# Patient Record
Sex: Female | Born: 1980 | Race: Black or African American | Hispanic: No | Marital: Single | Smoking: Current some day smoker
Health system: Southern US, Community
[De-identification: ages and names within clinical notes are randomized; demographics above are authoritative.]

## PROBLEM LIST (undated history)

## (undated) HISTORY — PX: ANTERIOR CRUCIATE LIGAMENT (ACL) REVISION: SHX6707

---

## 2018-06-13 ENCOUNTER — Ambulatory Visit (INDEPENDENT_AMBULATORY_CARE_PROVIDER_SITE_OTHER): Payer: Self-pay | Admitting: Physician Assistant

## 2018-07-04 ENCOUNTER — Ambulatory Visit (INDEPENDENT_AMBULATORY_CARE_PROVIDER_SITE_OTHER): Payer: Self-pay | Admitting: Family Medicine

## 2018-07-10 ENCOUNTER — Encounter (INDEPENDENT_AMBULATORY_CARE_PROVIDER_SITE_OTHER): Payer: Self-pay | Admitting: Internal Medicine

## 2018-07-10 ENCOUNTER — Ambulatory Visit (INDEPENDENT_AMBULATORY_CARE_PROVIDER_SITE_OTHER): Payer: Medicaid Other | Admitting: Internal Medicine

## 2018-07-10 ENCOUNTER — Other Ambulatory Visit: Payer: Self-pay

## 2018-07-10 VITALS — BP 126/82 | HR 86 | Temp 98.5°F | Ht 66.0 in | Wt 249.0 lb

## 2018-07-10 DIAGNOSIS — F17210 Nicotine dependence, cigarettes, uncomplicated: Secondary | ICD-10-CM | POA: Diagnosis not present

## 2018-07-10 DIAGNOSIS — E039 Hypothyroidism, unspecified: Secondary | ICD-10-CM | POA: Diagnosis not present

## 2018-07-10 DIAGNOSIS — R030 Elevated blood-pressure reading, without diagnosis of hypertension: Secondary | ICD-10-CM

## 2018-07-10 DIAGNOSIS — Z6841 Body Mass Index (BMI) 40.0 and over, adult: Secondary | ICD-10-CM

## 2018-07-10 DIAGNOSIS — G43509 Persistent migraine aura without cerebral infarction, not intractable, without status migrainosus: Secondary | ICD-10-CM

## 2018-07-10 DIAGNOSIS — F17200 Nicotine dependence, unspecified, uncomplicated: Secondary | ICD-10-CM | POA: Insufficient documentation

## 2018-07-10 MED ORDER — LEVOTHYROXINE SODIUM 75 MCG PO TABS: 75.0000 ug | ORAL_TABLET | Freq: Every day | ORAL | 2 refills | Status: DC

## 2018-07-10 MED ORDER — TOPIRAMATE 25 MG PO TABS: 25.0000 mg | ORAL_TABLET | Freq: Every day | ORAL | 3 refills | Status: DC

## 2018-07-10 MED ORDER — SUMATRIPTAN SUCCINATE 50 MG PO TABS: ORAL_TABLET | ORAL | 2 refills | Status: DC

## 2018-07-10 MED ORDER — CLONIDINE HCL 0.1 MG PO TABS: 0.1000 mg | ORAL_TABLET | Freq: Once | ORAL | Status: DC

## 2018-07-10 NOTE — Patient Instructions (Signed)
Stop over-the-counter pain medications.  Start taking Topamax at bedtime to help decrease the frequency of the headaches.  Take Imitrex as needed for the headache.  Follow a Healthy Eating Plan - You can do it! Limit sugary drinks.  Avoid sodas, sweet tea, sport or energy drinks, or fruit drinks.  Drink water, lo-fat milk, or diet drinks. Limit snack foods.   Cut back on candy, cake, cookies, chips, ice cream.  These are a special treat, only in small amounts. Eat plenty of vegetables.  Especially dark green, red, and orange vegetables. Aim for at least 3 servings a day. More is better! Include fruit in your daily diet.  Whole fruit is much healthier than fruit juice! Limit "white" bread, "white" pasta, "white" rice.   Choose "100% whole grain" products, brown or wild rice. Avoid fatty meats. Try "Meatless Monday" and choose eggs or beans one day a week.  When eating meat, choose lean meats like chicken, [place], and fish.  Grill, broil, or bake meats instead of frying, and eat poultry without the skin. Eat less salt.  Avoid frozen pizzas, frozen dinners and salty foods.  Use seasonings other than salt in cooking.  This can help blood pressure and keep you from swelling Beer, wine and liquor have calories.  If you can safely drink alcohol, limit to 1 drink per day for women, 2 drinks for men   Migraine Headache  A migraine headache is a very strong throbbing pain on one side or both sides of your head. Migraines can also cause other symptoms. Talk with your doctor about what things may bring on (trigger) your migraine headaches. Follow these instructions at home: Medicines  Take over-the-counter and prescription medicines only as told by your doctor.  Do not drive or use heavy machinery while taking prescription pain medicine.  To prevent or treat constipation while you are taking prescription pain medicine, your doctor may recommend that you: ? Drink enough fluid to keep your pee (urine)  clear or pale yellow. ? Take over-the-counter or prescription medicines. ? Eat foods that are high in fiber. These include fresh fruits and vegetables, whole grains, and beans. ? Limit foods that are high in fat and processed sugars. These include fried and sweet foods. Lifestyle  Avoid alcohol.  Do not use any products that contain nicotine or tobacco, such as cigarettes and e-cigarettes. If you need help quitting, ask your doctor.  Get at least 8 hours of sleep every night.  Limit your stress. General instructions   Keep a journal to find out what may bring on your migraines. For example, write down: ? What you eat and drink. ? How much sleep you get. ? Any change in what you eat or drink. ? Any change in your medicines.  If you have a migraine: ? Avoid things that make your symptoms worse, such as bright lights. ? It may help to lie down in a dark, quiet room. ? Do not drive or use heavy machinery. ? Ask your doctor what activities are safe for you.  Keep all follow-up visits as told by your doctor. This is important. Contact a doctor if:  You get a migraine that is different or worse than your usual migraines. Get help right away if:  Your migraine gets very bad.  You have a fever.  You have a stiff neck.  You have trouble seeing.  Your muscles feel weak or like you cannot control them.  You start to lose your balance a  lot.  You start to have trouble walking.  You pass out (faint). This information is not intended to replace advice given to you by your health care provider. Make sure you discuss any questions you have with your health care provider. Document Released: 03/09/2008 Document Revised: 02/22/2018 Document Reviewed: 11/17/2015 Elsevier Interactive Patient Education  2019 [employer].

## 2018-07-10 NOTE — Progress Notes (Signed)
Patient ID: Hannah Armstrong, female    DOB: 06/06/1981  MRN: [id number]  CC: New Patient (Initial Visit) (hypothyroidism )   Subjective: Hannah DeputyShanta McCaskill-Norell Armstrong is a 38 y.o. female who presents for new pt visit at [facility].  Her daughter is with her. Her concerns today include:   [place] from [place] 5 m[place] ago.  This is her first appt with physician here in [place].  Gives hx of hypothyroidism dx in 2014. Ran out of Synthroid for 3.5 mths.   Today she c/o having HA for past 2-3 wks but more consistent for past 3 days.  Had similar symptoms in 2014 -HA moves around.  Can be frontal or all over.  No N/V or blurred vision.  Endorses photophobia and phenophobia.  Feels better laying down.  HA comes on suddenly and can last for several days.  No triggers identified other than thinking it is due to her being out of her thyroid medication.  She has been donating plasma weekly and wonders whether that has played a role. -taking Advil and Tylenol BID without relief.    Tob dep:  Smoked off and on since age 38.  Smoke about 2-3 cig a day.  Did not smoke all of last yr. she feels that she can quit on her own and is ready to give a trial of quitting.  Patient noted to be obese.  She reports that she walks a lot as she does not drive.  She walks 15 minutes to and from the bus stop with her child every day.  Admits that she can do better with her eating habits.  She loves sweet drinks and eats a lot of white carbohydrates.  Family history, social history and surgical histories reviewed.  No current outpatient medications on file prior to visit.   No current facility-administered medications on file prior to visit.     No Known Allergies  Social History   Socioeconomic History  . Marital status: Unknown    Spouse name: Not on file  . Number of children: Not on file  . Years of education: Not on file  . Highest education level: Not on file  Occupational History  .  Not on file  Social Needs  . Financial resource strain: Not on file  . Food insecurity:    Worry: Not on file    Inability: Not on file  . Transportation needs:    Medical: Not on file    Non-medical: Not on file  Tobacco Use  . Smoking status: Current Some Day Smoker    Packs/day: 0.25    Years: 16.00    Pack years: 4.00    Types: Cigarettes  . Smokeless tobacco: Never Used  Substance and Sexual Activity  . Alcohol use: Not Currently  . Drug use: Yes    Comment: CBD - hump  . Sexual activity: Not on file  Lifestyle  . Physical activity:    Days per week: Not on file    Minutes per session: Not on file  . Stress: Not on file  Relationships  . Social connections:    Talks on phone: Not on file    Gets together: Not on file    Attends religious service: Not on file    Active member of club or organization: Not on file    Attends meetings of clubs or organizations: Not on file    Relationship status: Not on file  . Intimate partner violence:  Fear of current or ex partner: Not on file    Emotionally abused: Not on file    Physically abused: Not on file    Forced sexual activity: Not on file  Other Topics Concern  . Not on file  Social History Narrative  . Not on file    Family History  Problem Relation Age of Onset  . Hypertension Mother   . Breast cancer Sister   . Diabetes Maternal Aunt   . Sickle cell anemia Paternal Aunt   . Breast cancer Paternal Aunt     Past Surgical History:  Procedure Laterality Date  . ANTERIOR CRUCIATE LIGAMENT (ACL) REVISION Right 2016 Nov    ROS: Review of Systems  Constitutional: Negative for activity change.  HENT: Negative for congestion.   Respiratory: Negative for cough and shortness of breath.   Cardiovascular: Negative for chest pain.  Neurological: Positive for headaches.    PHYSICAL EXAM: BP 126/82 (BP Location: Right Arm, Patient Position: Sitting, Cuff Size: Large)   Pulse 86   Temp 98.5 F (36.9 C) (Oral)    Ht 5\' 6"  (1.676 m)   Wt 249 lb (112.9 kg)   LMP 06/26/2018 (Approximate)   SpO2 94%   BMI 40.19 kg/m   Physical Exam  General appearance - alert, well appearing, young obese AAF and in no distress Mental status - normal mood, behavior, speech, dress, motor activity, and thought processes Eyes - pupils equal and reactive, extraocular eye movements intact Mouth - mucous membranes moist, pharynx normal without lesions Neck - supple, no significant adenopathy.  No thyroid nodules or thyroid enlargement Chest - clear to auscultation, no wheezes, rales or rhonchi, symmetric air entry Heart - normal rate, regular rhythm, normal S1, S2, no murmurs, rubs, clicks or gallops Neurological - cranial nerves II through XII intact, motor and sensory grossly normal bilaterally Extremities - peripheral pulses normal, no pedal edema, no clubbing or cyanosis   ASSESSMENT AND PLAN:  1. Acquired hypothyroidism Restart Synthroid.  Check TSH today. - levothyroxine (SYNTHROID, LEVOTHROID) 75 MCG tablet; Take 1 tablet (75 mcg total) by mouth daily before breakfast.  Dispense: 30 tablet; Refill: 2 - TSH+T4F+T3Free  2. Persistent migraine aura without cerebral infarction and without status migrainosus, not intractable Discussed diagnosis and management.  We will start her on Topamax for prophylaxis and Imitrex for abortive therapy. - SUMAtriptan (IMITREX) 50 MG tablet; Take 1 tab at the start of the headache.  May repeat in 2 hours if no relief.  Max 2 tabs/24 hours  Dispense: 10 tablet; Refill: 2 - topiramate (TOPAMAX) 25 MG tablet; Take 1 tablet (25 mg total) by mouth at bedtime.  Dispense: 30 tablet; Refill: 3  3. Morbid obesity (HCC) Healthy eating habits discussed and encouraged.  Advised to do away with sugary drinks including sodas, juices and sweet tea.  Advised to limit portion sizes on white carbohydrates and to eat more white meat than red meat.  Commended her on being active and encouraged her to  continue to do so.  The goal is to get in at least 150 minutes of aerobic exercise per week - Comprehensive metabolic panel - Lipid panel - Hemoglobin A1c  4. Tobacco dependence Patient advised to quit smoking. Discussed health risks associated with smoking including lung and other types of cancers, chronic lung diseases and CV risks.. Pt ready to give trail of quitting.  Discussed methods to help quit including quitting cold [place], use of NRT, Chantix and Bupropion.  Patient feels that she  can quit cold [place] and plans to do so About 3 minutes spent on counseling  5. Elevated blood pressure reading Inform patient that normal blood pressure is 120/80 or lower.  DASH diet discussed and encouraged.  Blood pressure can be rechecked on follow-up visit.    Patient was given the opportunity to ask questions.  Patient verbalized understanding of the plan and was able to repeat key elements of the plan.   Orders Placed This Encounter  Procedures  . Comprehensive metabolic panel  . Lipid panel  . Hemoglobin A1c  . TSH+T4F+T3Free     Requested Prescriptions   Signed Prescriptions Disp Refills  . SUMAtriptan (IMITREX) 50 MG tablet 10 tablet 2    Sig: Take 1 tab at the start of the headache.  May repeat in 2 hours if no relief.  Max 2 tabs/24 hours  . topiramate (TOPAMAX) 25 MG tablet 30 tablet 3    Sig: Take 1 tablet (25 mg total) by mouth at bedtime.  Marland Kitchen. levothyroxine (SYNTHROID, LEVOTHROID) 75 MCG tablet 30 tablet 2    Sig: Take 1 tablet (75 mcg total) by mouth daily before breakfast.    Return in about 6 weeks (around 08/21/2018) for thyroid.  Jonah Blueeborah Toa Mia, MD, FACP

## 2018-07-11 LAB — CBC
Hematocrit: 37.3 % (ref 34.0–46.6)
Hemoglobin: 12.6 g/dL (ref 11.1–15.9)
MCH: 30.6 pg (ref 26.6–33.0)
MCHC: 33.8 g/dL (ref 31.5–35.7)
MCV: 91 fL (ref 79–97)
Platelets: 253 10*3/uL (ref 150–450)
RBC: 4.12 x10E6/uL (ref 3.77–5.28)
RDW: 13 % (ref 11.7–15.4)
WBC: 8.1 10*3/uL (ref 3.4–10.8)

## 2018-07-11 LAB — HEMOGLOBIN A1C
Est. average glucose Bld gHb Est-mCnc: 114 mg/dL
Hgb A1c MFr Bld: 5.6 % (ref 4.8–5.6)

## 2018-07-11 LAB — LIPID PANEL
Chol/HDL Ratio: 3.4 ratio (ref 0.0–4.4)
Cholesterol, Total: 121 mg/dL (ref 100–199)
HDL: 36 mg/dL — ABNORMAL LOW (ref >39)
LDL Calculated: 77 mg/dL (ref 0–99)
Triglycerides: 39 mg/dL (ref 0–149)
VLDL Cholesterol Cal: 8 mg/dL (ref 5–40)

## 2018-07-11 LAB — COMPREHENSIVE METABOLIC PANEL
A/G RATIO: 1.8 (ref 1.2–2.2)
ALT: 14 IU/L (ref 0–32)
AST: 19 IU/L (ref 0–40)
Albumin: 4.4 g/dL (ref 3.8–4.8)
Alkaline Phosphatase: 74 IU/L (ref 39–117)
BUN/Creatinine Ratio: 8 — ABNORMAL LOW (ref 9–23)
BUN: 8 mg/dL (ref 6–20)
Bilirubin Total: 0.3 mg/dL (ref 0.0–1.2)
CO2: 21 mmol/L (ref 20–29)
Calcium: 9.1 mg/dL (ref 8.7–10.2)
Chloride: 101 mmol/L (ref 96–106)
Creatinine, Ser: 1.02 mg/dL — ABNORMAL HIGH (ref 0.57–1.00)
GFR calc Af Amer: 82 mL/min/{1.73_m2} (ref >59)
GFR calc non Af Amer: 71 mL/min/{1.73_m2} (ref >59)
Globulin, Total: 2.5 g/dL (ref 1.5–4.5)
Glucose: 83 mg/dL (ref 65–99)
Potassium: 4 mmol/L (ref 3.5–5.2)
SODIUM: 137 mmol/L (ref 134–144)
Total Protein: 6.9 g/dL (ref 6.0–8.5)

## 2018-07-11 LAB — TSH+T4F+T3FREE
Free T4: 1.14 ng/dL (ref 0.82–1.77)
T3 FREE: 2.3 pg/mL (ref 2.0–4.4)
TSH: 0.372 u[IU]/mL — AB (ref 0.450–4.500)

## 2018-07-13 ENCOUNTER — Telehealth (INDEPENDENT_AMBULATORY_CARE_PROVIDER_SITE_OTHER): Payer: Self-pay

## 2018-07-13 NOTE — Telephone Encounter (Signed)
Patient is aware that thyroid level suggests she may be developing over functioning thyroid. It is recommended for to stop taking synthroid and return for labs in 6-8 weeks at her next OV in march; to recheck thyroid level and see what it does. Blood count, kidney and liver function normal. Cholesterol is good. Diabetes screen negative. Hannah Armstrong, CMA

## 2018-07-13 NOTE — Telephone Encounter (Signed)
-----   Message from Marcine Matar, MD sent at 07/12/2018 11:09 AM EST ----- Let pt know that her thyroid level suggest she may be developing over functioning thyroid.  I recommend stopping the Synthroid (Levothyroxine) and returning to the lab in 6-8 weeks for recheck of thyroid level to see what it does.  She has a f/u in March so it can be rechecked on that visit.  Blood count, kidney and liver function tests are normal.Cholesterol level is good.  Diabetes screen is negative.

## 2018-08-28 ENCOUNTER — Ambulatory Visit (INDEPENDENT_AMBULATORY_CARE_PROVIDER_SITE_OTHER): Payer: Medicaid Other | Admitting: Primary Care

## 2018-09-08 ENCOUNTER — Other Ambulatory Visit (INDEPENDENT_AMBULATORY_CARE_PROVIDER_SITE_OTHER): Payer: Medicaid Other

## 2018-09-20 ENCOUNTER — Ambulatory Visit: Payer: Medicaid Other | Attending: Primary Care

## 2018-09-20 ENCOUNTER — Ambulatory Visit: Payer: Medicaid Other | Admitting: Primary Care

## 2018-09-20 ENCOUNTER — Other Ambulatory Visit: Payer: Self-pay

## 2018-09-20 DIAGNOSIS — E039 Hypothyroidism, unspecified: Secondary | ICD-10-CM

## 2018-09-21 ENCOUNTER — Telehealth: Payer: Self-pay

## 2018-09-21 LAB — TSH: TSH: 1.46 u[IU]/mL (ref 0.450–4.500)

## 2018-09-21 NOTE — Telephone Encounter (Signed)
Contacted pt to go over lab results pt didn't answer left a detailed vm informing pt of results and if she has any questions or concerns to give me a call  

## 2018-09-26 ENCOUNTER — Telehealth: Payer: Self-pay | Admitting: Internal Medicine

## 2018-09-26 NOTE — Telephone Encounter (Signed)
New Message   Pt calling to check on the status of her labs. Pt is concerned  If she will pass away tonight. Please call

## 2018-09-27 NOTE — Telephone Encounter (Signed)
Contacted pt and went over lab results. Pt is schedule for her next lab appointment 6/15at 2pm

## 2018-10-23 ENCOUNTER — Encounter (HOSPITAL_COMMUNITY): Payer: Self-pay

## 2018-10-23 ENCOUNTER — Encounter (HOSPITAL_COMMUNITY): Payer: Self-pay | Admitting: Emergency Medicine

## 2018-10-23 ENCOUNTER — Emergency Department (HOSPITAL_COMMUNITY)
Admission: EM | Admit: 2018-10-23 | Discharge: 2018-10-23 | Disposition: A | Discharge status: Other Acute Inpatient Hospital | Payer: Medicaid Other | Attending: Emergency Medicine | Admitting: Emergency Medicine

## 2018-10-23 ENCOUNTER — Other Ambulatory Visit: Payer: Self-pay

## 2018-10-23 ENCOUNTER — Inpatient Hospital Stay (HOSPITAL_COMMUNITY)
Admission: AD | Admit: 2018-10-23 | Discharge: 2018-10-27 | DRG: 885 | Disposition: A | Discharge status: Home / Self Care | Payer: Medicaid Other | Attending: Psychiatry | Admitting: Psychiatry

## 2018-10-23 DIAGNOSIS — F909 Attention-deficit hyperactivity disorder, unspecified type: Secondary | ICD-10-CM | POA: Diagnosis present

## 2018-10-23 DIAGNOSIS — Z1159 Encounter for screening for other viral diseases: Secondary | ICD-10-CM | POA: Insufficient documentation

## 2018-10-23 DIAGNOSIS — F312 Bipolar disorder, current episode manic severe with psychotic features: Secondary | ICD-10-CM | POA: Insufficient documentation

## 2018-10-23 DIAGNOSIS — F17210 Nicotine dependence, cigarettes, uncomplicated: Secondary | ICD-10-CM | POA: Diagnosis present

## 2018-10-23 DIAGNOSIS — R45851 Suicidal ideations: Secondary | ICD-10-CM | POA: Insufficient documentation

## 2018-10-23 DIAGNOSIS — E039 Hypothyroidism, unspecified: Secondary | ICD-10-CM | POA: Diagnosis present

## 2018-10-23 DIAGNOSIS — Z9119 Patient's noncompliance with other medical treatment and regimen: Secondary | ICD-10-CM | POA: Diagnosis not present

## 2018-10-23 DIAGNOSIS — F250 Schizoaffective disorder, bipolar type: Secondary | ICD-10-CM | POA: Diagnosis not present

## 2018-10-23 DIAGNOSIS — F259 Schizoaffective disorder, unspecified: Secondary | ICD-10-CM | POA: Diagnosis present

## 2018-10-23 DIAGNOSIS — R451 Restlessness and agitation: Secondary | ICD-10-CM | POA: Insufficient documentation

## 2018-10-23 DIAGNOSIS — F419 Anxiety disorder, unspecified: Secondary | ICD-10-CM | POA: Insufficient documentation

## 2018-10-23 DIAGNOSIS — Z9114 Patient's other noncompliance with medication regimen: Secondary | ICD-10-CM | POA: Insufficient documentation

## 2018-10-23 DIAGNOSIS — R51 Headache: Secondary | ICD-10-CM | POA: Insufficient documentation

## 2018-10-23 DIAGNOSIS — Z046 Encounter for general psychiatric examination, requested by authority: Secondary | ICD-10-CM | POA: Insufficient documentation

## 2018-10-23 DIAGNOSIS — Z79899 Other long term (current) drug therapy: Secondary | ICD-10-CM | POA: Insufficient documentation

## 2018-10-23 LAB — RAPID URINE DRUG SCREEN, HOSP PERFORMED
Amphetamines: POSITIVE — AB
Barbiturates: NOT DETECTED
Benzodiazepines: NOT DETECTED
Cocaine: NOT DETECTED
Opiates: NOT DETECTED
Tetrahydrocannabinol: POSITIVE — AB

## 2018-10-23 LAB — COMPREHENSIVE METABOLIC PANEL
ALT: 30 U/L (ref 0–44)
AST: 33 U/L (ref 15–41)
Albumin: 4.9 g/dL (ref 3.5–5.0)
Alkaline Phosphatase: 59 U/L (ref 38–126)
Anion gap: 10 (ref 5–15)
BUN: 9 mg/dL (ref 6–20)
CO2: 23 mmol/L (ref 22–32)
Calcium: 9 mg/dL (ref 8.9–10.3)
Chloride: 103 mmol/L (ref 98–111)
Creatinine, Ser: 0.93 mg/dL (ref 0.44–1.00)
GFR calc Af Amer: >60 mL/min (ref >60)
GFR calc non Af Amer: >60 mL/min (ref >60)
Glucose, Bld: 105 mg/dL — ABNORMAL HIGH (ref 70–99)
Potassium: 3.3 mmol/L — ABNORMAL LOW (ref 3.5–5.1)
Sodium: 136 mmol/L (ref 135–145)
Total Bilirubin: 1 mg/dL (ref 0.3–1.2)
Total Protein: 8.1 g/dL (ref 6.5–8.1)

## 2018-10-23 LAB — CBC
HCT: 38.4 % (ref 36.0–46.0)
Hemoglobin: 12.4 g/dL (ref 12.0–15.0)
MCH: 30.5 pg (ref 26.0–34.0)
MCHC: 32.3 g/dL (ref 30.0–36.0)
MCV: 94.3 fL (ref 80.0–100.0)
Platelets: 288 10*3/uL (ref 150–400)
RBC: 4.07 MIL/uL (ref 3.87–5.11)
RDW: 14.5 % (ref 11.5–15.5)
WBC: 13 10*3/uL — ABNORMAL HIGH (ref 4.0–10.5)
nRBC: 0 % (ref 0.0–0.2)

## 2018-10-23 LAB — SALICYLATE LEVEL: Salicylate Lvl: <7 mg/dL (ref 2.8–30.0)

## 2018-10-23 LAB — PREGNANCY, URINE: Preg Test, Ur: NEGATIVE

## 2018-10-23 LAB — SARS CORONAVIRUS 2 BY RT PCR (HOSPITAL ORDER, PERFORMED IN ~~LOC~~ HOSPITAL LAB): SARS Coronavirus 2: NEGATIVE

## 2018-10-23 LAB — ACETAMINOPHEN LEVEL: Acetaminophen (Tylenol), Serum: <10 ug/mL — ABNORMAL LOW (ref 10–30)

## 2018-10-23 LAB — ETHANOL: Alcohol, Ethyl (B): <10 mg/dL (ref <10)

## 2018-10-23 MED ORDER — LEVOTHYROXINE SODIUM 50 MCG PO TABS
50.0000 ug | ORAL_TABLET | Freq: Every day | ORAL | Status: DC
  Administered 2018-10-25 – 2018-10-27 (×3): 50 ug via ORAL
  Filled 2018-10-23 (×5): qty 1
  Filled 2018-10-23 (×2): qty 2

## 2018-10-23 MED ORDER — MAGNESIUM HYDROXIDE 400 MG/5ML PO SUSP: 30.0000 mL | Freq: Every day | ORAL | Status: DC | PRN

## 2018-10-23 MED ORDER — ONDANSETRON HCL 4 MG PO TABS: 4.0000 mg | ORAL_TABLET | Freq: Three times a day (TID) | ORAL | Status: DC | PRN

## 2018-10-23 MED ORDER — OLANZAPINE 5 MG PO TBDP
15.0000 mg | ORAL_TABLET | Freq: Once | ORAL | Status: AC
  Administered 2018-10-23: 15 mg via ORAL
  Filled 2018-10-23: qty 1

## 2018-10-23 MED ORDER — NICOTINE 21 MG/24HR TD PT24
21.0000 mg | MEDICATED_PATCH | Freq: Every day | TRANSDERMAL | Status: DC
  Administered 2018-10-23: 21 mg via TRANSDERMAL
  Filled 2018-10-23: qty 1

## 2018-10-23 MED ORDER — TRAZODONE HCL 150 MG PO TABS
150.0000 mg | ORAL_TABLET | Freq: Every evening | ORAL | Status: DC | PRN
  Filled 2018-10-23: qty 1

## 2018-10-23 MED ORDER — PROPRANOLOL HCL 20 MG PO TABS
20.0000 mg | ORAL_TABLET | Freq: Two times a day (BID) | ORAL | Status: DC
  Administered 2018-10-24 – 2018-10-27 (×4): 20 mg via ORAL
  Filled 2018-10-23 (×6): qty 1
  Filled 2018-10-23: qty 2
  Filled 2018-10-23 (×4): qty 1

## 2018-10-23 MED ORDER — ACETAMINOPHEN 325 MG PO TABS
650.0000 mg | ORAL_TABLET | Freq: Four times a day (QID) | ORAL | Status: DC | PRN
  Administered 2018-10-24: 650 mg via ORAL
  Filled 2018-10-23: qty 2

## 2018-10-23 MED ORDER — ALUM & MAG HYDROXIDE-SIMETH 200-200-20 MG/5ML PO SUSP: 30.0000 mL | ORAL | Status: DC | PRN

## 2018-10-23 MED ORDER — LORAZEPAM 2 MG/ML IJ SOLN
1.0000 mg | Freq: Once | INTRAMUSCULAR | Status: AC
  Administered 2018-10-23: 1 mg via INTRAMUSCULAR
  Filled 2018-10-23: qty 1

## 2018-10-23 MED ORDER — LEVOTHYROXINE SODIUM 75 MCG PO TABS: 75.0000 ug | ORAL_TABLET | Freq: Every day | ORAL | Status: DC

## 2018-10-23 MED ORDER — LORAZEPAM 2 MG/ML IJ SOLN: 1.0000 mg | Freq: Once | INTRAMUSCULAR | Status: DC

## 2018-10-23 MED ORDER — ZIPRASIDONE MESYLATE 20 MG IM SOLR
20.0000 mg | Freq: Once | INTRAMUSCULAR | Status: AC
  Administered 2018-10-23: 20 mg via INTRAMUSCULAR
  Filled 2018-10-23 (×2): qty 20

## 2018-10-23 MED ORDER — ZIPRASIDONE MESYLATE 20 MG IM SOLR
INTRAMUSCULAR | Status: AC
  Filled 2018-10-23: qty 20

## 2018-10-23 MED ORDER — ACETAMINOPHEN 325 MG PO TABS: 650.0000 mg | ORAL_TABLET | ORAL | Status: DC | PRN

## 2018-10-23 MED ORDER — CLONAZEPAM 1 MG PO TABS
1.0000 mg | ORAL_TABLET | Freq: Three times a day (TID) | ORAL | Status: AC
  Administered 2018-10-24 – 2018-10-25 (×4): 1 mg via ORAL
  Filled 2018-10-23 (×5): qty 1

## 2018-10-23 MED ORDER — LORAZEPAM 2 MG/ML IJ SOLN
4.0000 mg | Freq: Once | INTRAMUSCULAR | Status: AC
  Administered 2018-10-23: 4 mg via INTRAMUSCULAR
  Filled 2018-10-23: qty 2

## 2018-10-23 MED ORDER — TEMAZEPAM 30 MG PO CAPS
30.0000 mg | ORAL_CAPSULE | Freq: Every day | ORAL | Status: DC
  Administered 2018-10-23 – 2018-10-26 (×3): 30 mg via ORAL
  Filled 2018-10-23 (×2): qty 1

## 2018-10-23 MED ORDER — HALOPERIDOL 5 MG PO TABS
5.0000 mg | ORAL_TABLET | Freq: Three times a day (TID) | ORAL | Status: DC
  Administered 2018-10-24: 5 mg via ORAL
  Filled 2018-10-23 (×7): qty 1

## 2018-10-23 MED ORDER — HYDROXYZINE HCL 50 MG PO TABS: 50.0000 mg | ORAL_TABLET | Freq: Three times a day (TID) | ORAL | Status: DC | PRN

## 2018-10-23 MED ORDER — RISPERIDONE 0.5 MG PO TABS
0.5000 mg | ORAL_TABLET | Freq: Two times a day (BID) | ORAL | Status: DC
  Administered 2018-10-23: 0.5 mg via ORAL
  Filled 2018-10-23: qty 1

## 2018-10-23 MED ORDER — BENZTROPINE MESYLATE 0.5 MG PO TABS
0.5000 mg | ORAL_TABLET | Freq: Two times a day (BID) | ORAL | Status: DC
  Administered 2018-10-23 – 2018-10-27 (×7): 0.5 mg via ORAL
  Filled 2018-10-23 (×12): qty 1

## 2018-10-23 MED ORDER — TOPIRAMATE 25 MG PO TABS: 25.0000 mg | ORAL_TABLET | Freq: Every day | ORAL | Status: DC

## 2018-10-23 MED ORDER — SUMATRIPTAN SUCCINATE 25 MG PO TABS
25.0000 mg | ORAL_TABLET | Freq: Once | ORAL | Status: AC
  Administered 2018-10-23: 25 mg via ORAL
  Filled 2018-10-23: qty 1

## 2018-10-23 MED ORDER — LORAZEPAM 1 MG PO TABS
1.0000 mg | ORAL_TABLET | Freq: Once | ORAL | Status: DC
  Filled 2018-10-23: qty 1

## 2018-10-23 NOTE — ED Notes (Signed)
Mia PA at bedside at this time.

## 2018-10-23 NOTE — H&P (Signed)
Psychiatric Admission Assessment Adult  Patient Identification: Hannah Armstrong MRN:  [id number] Date of Evaluation:  10/23/2018 Chief Complaint:  Unspecified psychosis Principal Diagnosis: Disorganized and psychotic behavior in the context of a positive drug screen and unknown past psych history Diagnosis:  Active Problems:   Schizoaffective disorder, bipolar type (HCC)  History of Present Illness:   Ms. Hannah Armstrong is 38 years of age she is unable to give me much of a history she presented under petition for involuntary commitment and she was described as paranoid and disorganized numbering from auditory hallucinations as well.  Her drug screen is positive for amphetamines and cannabis but she will not answer questions meaningfully regarding a past psych history drug use so forth.  She is quite hyper religious disorganized throws her arms in the air stating "they want to hear the truth" and requires IM medications that she will not cooperate even with going in the exam room for search and interview  Collateral history and petition information indicates patient's been hearing auditory loose Nations specifically voices coming out of the TV when it is not turned on, she has been standing over family members while they are sleeping, and apparently she had multiple inpatient behavioral health admissions while she was living in [place] but there are none in our system she is also not been sleeping or eating in the last 4 to 7 days. She is also described as paranoid she cannot name what her medications are comorbidities to include hypothyroidism  Currently alert oriented to person as best I can tell would not answer other questions would not participate fully mental status exam testing rambling pressured hyper religious and disorganized without acute thoughts of harming self endorsed when asked specifically but will not elaborate just shakes her head.   Associated Signs/Symptoms: Depression  Symptoms:  psychomotor agitation, (Hypo) Manic Symptoms:  Delusions, Flight of Ideas, Hallucinations, Anxiety Symptoms:  Panic Symptoms, Psychotic Symptoms:  Delusions, Hallucinations: Auditory PTSD Symptoms: NA Total Time spent with patient: 45 minutes  Past Psychiatric History: ukn  Is the patient at risk to self? Yes.    Has the patient been a risk to self in the past 6 months? No.  Has the patient been a risk to self within the distant past? No.  Is the patient a risk to others? Yes.    Has the patient been a risk to others in the past 6 months? No.  Has the patient been a risk to others within the distant past? No.   Prior Inpatient Therapy:  [place] x5 by report Prior Outpatient Therapy:  None here  Alcohol Screening:   Substance Abuse History in the last 12 months:  Yes.   Consequences of Substance Abuse: NA Previous Psychotropic Medications: Yes  Psychological Evaluations: No  Past Medical History: No past medical history on file.  Past Surgical History:  Procedure Laterality Date  . ANTERIOR CRUCIATE LIGAMENT (ACL) REVISION Right 2016 Nov   Family History:  Family History  Problem Relation Age of Onset  . Hypertension Mother   . Breast cancer Sister   . Diabetes Maternal Aunt   . Sickle cell anemia Paternal Aunt   . Breast cancer Paternal Aunt    Family Psychiatric  History: Unknown to patient or [place] Tobacco Screening:   Social History:  Social History   Substance and Sexual Activity  Alcohol Use Not Currently     Social History   Substance and Sexual Activity  Drug Use Yes   Comment: CBD - hump  Additional Social History:                           Allergies:  No Known Allergies Lab Results:  Results for orders placed or performed during the hospital encounter of 10/23/18 (from the past 48 hour(s))  Comprehensive metabolic panel     Status: Abnormal   Collection Time: 10/23/18  5:15 AM  Result Value Ref Range   Sodium 136 135 -  145 mmol/L   Potassium 3.3 (L) 3.5 - 5.1 mmol/L   Chloride 103 98 - 111 mmol/L   CO2 23 22 - 32 mmol/L   Glucose, Bld 105 (H) 70 - 99 mg/dL   BUN 9 6 - 20 mg/dL   Creatinine, Ser 8.410.93 0.44 - 1.00 mg/dL   Calcium 9.0 8.9 - 32.410.3 mg/dL   Total Protein 8.1 6.5 - 8.1 g/dL   Albumin 4.9 3.5 - 5.0 g/dL   AST 33 15 - 41 U/L   ALT 30 0 - 44 U/L   Alkaline Phosphatase 59 38 - 126 U/L   Total Bilirubin 1.0 0.3 - 1.2 mg/dL   GFR calc non Af Amer >60 >60 mL/min   GFR calc Af Amer >60 >60 mL/min   Anion gap 10 5 - 15    Comment: Performed at [facility], [address] [place], [place] [address]  Ethanol     Status: None   Collection Time: 10/23/18  5:15 AM  Result Value Ref Range   Alcohol, Ethyl (B) <10 <10 mg/dL    Comment: (NOTE) Lowest detectable limit for serum alcohol is 10 mg/dL. For medical purposes only. Performed at [facility], [address] [place], [place] [address]   Salicylate level     Status: None   Collection Time: 10/23/18  5:15 AM  Result Value Ref Range   Salicylate Lvl <7.0 2.8 - 30.0 mg/dL    Comment: Performed at [facility], [address] [place], [place] [address]  Acetaminophen level     Status: Abnormal   Collection Time: 10/23/18  5:15 AM  Result Value Ref Range   Acetaminophen (Tylenol), Serum <10 (L) 10 - 30 ug/mL    Comment: (NOTE) Therapeutic concentrations vary significantly. A range of 10-30 ug/mL  may be an effective concentration for many patients. However, some  are best treated at concentrations outside of this range. Acetaminophen concentrations >150 ug/mL at 4 hours after ingestion  and >50 ug/mL at 12 hours after ingestion are often associated with  toxic reactions. Performed at [facility], [address] [place], [place] [address]   cbc     Status: Abnormal   Collection Time: 10/23/18  5:15 AM  Result Value Ref Range   WBC 13.0 (H) 4.0 - 10.5 K/uL    RBC 4.07 3.87 - 5.11 MIL/uL   Hemoglobin 12.4 12.0 - 15.0 g/dL   HCT 95.638.4 38.736.0 - 56.446.0 %   MCV 94.3 80.0 - 100.0 fL   MCH 30.5 26.0 - 34.0 pg   MCHC 32.3 30.0 - 36.0 g/dL   RDW 33.214.5 95.111.5 - 88.415.5 %   Platelets 288 150 - 400 K/uL   nRBC 0.0 0.0 - 0.2 %    Comment: Performed at [facility], [address] [place], [place] [address]  Rapid urine drug screen (hospital performed)     Status: Abnormal   Collection Time: 10/23/18  5:15 AM  Result  Value Ref Range   Opiates NONE DETECTED NONE DETECTED   Cocaine NONE DETECTED NONE DETECTED   Benzodiazepines NONE DETECTED NONE DETECTED   Amphetamines POSITIVE (A) NONE DETECTED   Tetrahydrocannabinol POSITIVE (A) NONE DETECTED   Barbiturates NONE DETECTED NONE DETECTED    Comment: (NOTE) DRUG SCREEN FOR MEDICAL PURPOSES ONLY.  IF CONFIRMATION IS NEEDED FOR ANY PURPOSE, NOTIFY LAB WITHIN 5 DAYS. LOWEST DETECTABLE LIMITS FOR URINE DRUG SCREEN Drug Class                     Cutoff (ng/mL) Amphetamine and metabolites    1000 Barbiturate and metabolites    200 Benzodiazepine                 200 Tricyclics and metabolites     300 Opiates and metabolites        300 Cocaine and metabolites        300 THC                            50 Performed at [facility], [address], [place], [place] [address]   Pregnancy, urine     Status: None   Collection Time: 10/23/18  5:15 AM  Result Value Ref Range   Preg Test, Ur NEGATIVE NEGATIVE    Comment:        THE SENSITIVITY OF THIS METHODOLOGY IS >20 mIU/mL. Performed at [facility], [address], [place], [place] [address]   SARS Coronavirus 2 (CEPHEID - Performed in [facility] hospital lab), Hosp Order     Status: None   Collection Time: 10/23/18 10:11 AM  Result Value Ref Range   SARS Coronavirus 2 NEGATIVE NEGATIVE    Comment: (NOTE) If result is NEGATIVE SARS-CoV-2 target nucleic acids are NOT DETECTED. The SARS-CoV-2 RNA is  generally detectable in upper and lower  respiratory specimens during the acute phase of infection. The lowest  concentration of SARS-CoV-2 viral copies this assay can detect is 250  copies / mL. A negative result does not preclude SARS-CoV-2 infection  and should not be used as the sole basis for treatment or other  patient management decisions.  A negative result may occur with  improper specimen collection / handling, submission of specimen other  than nasopharyngeal swab, presence of viral mutation(s) within the  areas targeted by this assay, and inadequate number of viral copies  (<250 copies / mL). A negative result must be combined with clinical  observations, patient history, and epidemiological information. If result is POSITIVE SARS-CoV-2 target nucleic acids are DETECTED. The SARS-CoV-2 RNA is generally detectable in upper and lower  respiratory specimens dur ing the acute phase of infection.  Positive  results are indicative of active infection with SARS-CoV-2.  Clinical  correlation with patient history and other diagnostic information is  necessary to determine patient infection status.  Positive results do  not rule out bacterial infection or co-infection with other viruses. If result is PRESUMPTIVE POSTIVE SARS-CoV-2 nucleic acids MAY BE PRESENT.   A presumptive positive result was obtained on the submitted specimen  and confirmed on repeat testing.  While 2019 novel coronavirus  (SARS-CoV-2) nucleic acids may be present in the submitted sample  additional confirmatory testing may be necessary for epidemiological  and / or clinical management purposes  to differentiate between  SARS-CoV-2 and other Sarbecovirus currently known to infect humans.  If clinically indicated additional  testing with an alternate test  methodology [phone]) is advised. The SARS-CoV-2 RNA is generally  detectable in upper and lower respiratory sp ecimens during the acute  phase of  infection. The expected result is Negative. Fact Sheet for Patients:  BoilerBrush.com.cy Fact Sheet for Healthcare Providers: https://pope.com/ This test is not yet approved or cleared by the [place] FDA and has been authorized for detection and/or diagnosis of SARS-CoV-2 by FDA under an Emergency Use Authorization (EUA).  This EUA will remain in effect (meaning this test can be used) for the duration of the COVID-19 declaration under Section 564(b)(1) of the Act, 21 U.S.C. section 360bbb-3(b)(1), unless the authorization is terminated or revoked sooner. Performed at [facility], [address], [place], [place] [address]     Blood Alcohol level:  Lab Results  Component Value Date   ETH <10 10/23/2018    Metabolic Disorder Labs:  Lab Results  Component Value Date   HGBA1C 5.6 07/10/2018   No results found for: PROLACTIN Lab Results  Component Value Date   CHOL 121 07/10/2018   TRIG 39 07/10/2018   HDL 36 (L) 07/10/2018   CHOLHDL 3.4 07/10/2018   LDLCALC 77 07/10/2018    Current Medications: Current Facility-Administered Medications  Medication Dose Route Frequency Provider Last Rate Last Dose  . LORazepam (ATIVAN) injection 4 mg  4 mg Intramuscular Once Malvin Johns, MD      . ziprasidone (GEODON) injection 20 mg  20 mg Intramuscular Once Malvin Johns, MD       PTA Medications: Medications Prior to Admission  Medication Sig Dispense Refill Last Dose  . levothyroxine (SYNTHROID, LEVOTHROID) 75 MCG tablet Take 1 tablet (75 mcg total) by mouth daily before breakfast. 30 tablet 2 10/22/2018 at Unknown time  . SUMAtriptan (IMITREX) 50 MG tablet Take 1 tab at the start of the headache.  May repeat in 2 hours if no relief.  Max 2 tabs/24 hours 10 tablet 2 Past Week at Unknown time  . topiramate (TOPAMAX) 25 MG tablet Take 1 tablet (25 mg total) by mouth at bedtime. 30 tablet 3 10/23/2018 at Unknown time     Musculoskeletal: Strength & Muscle Tone: within normal limits Gait & Station: normal Patient leans: N/A  Psychiatric Specialty Exam: Physical Exam will not allow fuller physical than what is already been done  ROS illogical negative cardiovascular negative GI GU negative but not fully understanding question as to the level of psychosis  There were no vitals taken for this visit.There is no height or weight on file to calculate BMI.  General Appearance: Casual and Disheveled  Eye Contact:  Minimal  Speech:  Pressured  Volume:  Increased  Mood:  Irritable and Manic  Affect:  Non-Congruent and Labile  Thought Process:  Disorganized, Irrelevant and Descriptions of Associations: Loose  Orientation:  Full (Time, Place, and Person) in general person and the fact that she is hospitalized not exact date  Thought Content:  Illogical, Delusions and Hallucinations: Auditory  Suicidal Thoughts:  No  Homicidal Thoughts:  No  Memory:  Immediate;   Poor  Judgement:  Impaired  Insight:  Lacking  Psychomotor Activity: Agitated and self agitating  Concentration:  Concentration: Poor  Recall:  Poor  Fund of Knowledge:  Poor  Language:  Poor  Akathisia:  Negative  Handed:  Right  AIMS (if indicated):     Assets:  Physical Health Resilience  ADL's:  Intact  Cognition:  Impaired,  Moderate  Sleep:  Treatment Plan Summary: Daily contact with patient to assess and evaluate symptoms and progress in treatment and Medication management  Observation Level/Precautions:  15 minute checks  Laboratory:  UDS  Psychotherapy: Reality based redirection as needed  Medications: IM Ativan and Geodon acutely longer-term risperidone  Consultations: Not necessary  Discharge Concerns: Long-term stability  Estimated LOS: 7-10  Other: Axis I schizoaffective bipolar type is likely, complicated by amphetamine induced/or fueled psychosis and cannabis induced Or fueled psychosis   Physician Treatment  Plan for Primary Diagnosis:schizoaffective and substance abuse Long Term Goal(s): Improvement in symptoms so as ready for discharge  Short Term Goals: Ability to verbalize feelings will improve and Ability to disclose and discuss suicidal ideas  Physician Treatment Plan for Secondary Diagnosis: Active Problems:   Schizoaffective disorder, bipolar type (HCC)  Long Term Goal(s): Improvement in symptoms so as ready for discharge  Short Term Goals: Ability to maintain clinical measurements within normal limits will improve  I certify that inpatient services furnished can reasonably be expected to improve the patient's condition.    Malvin Johns, MD 5/11/20203:39 PM

## 2018-10-23 NOTE — ED Notes (Signed)
Bed: WTR5 Expected date:  Expected time:  Means of arrival:  Comments: 

## 2018-10-23 NOTE — ED Provider Notes (Signed)
[facility]EM[facility] DEPT Provider Note   CSN: [id number] Arrival date & time: 10/23/18  0435    History   Chief Complaint Chief Complaint  Patient presents with   Involuntary Commitment    HPI Hannah Armstrong is a 38 y.o. female with a history of acquired Armstrong, Hannah Armstrong, Hannah Armstrong, Hannah tobacco use disorder who presents to the emergency department accompanied by police under IVC.  The patient had IVC paperwork taken out by her mother.  Per IVC paperwork the patient's mother states respondent is ADHD.  She is hearing voices coming out the TV.  She has hearing phones ring that are not there.  [name] is standing over people while they sleep.  She is walking back Hannah forth.  [name] needs to be evaluated for possible mental illness.  On my evaluation, the patient endorses suicidal ideation.  When asked if she has a plan, she does not answer.  She denies homicidal ideation.  When asked if she has been having auditory or visual hallucinations, the patient does not answer.  Per chart review, on 09/26/2018, there is a telephone conversation where the patient called to check on the status of her labs.  Patient was concerned if she will pass away tonight Hannah was requesting a call back.  Spoke with the patient's mother, Hannah Armstrong. History is somewhat limited due to the quality of the reception during the phone call.  Reports that the patient was previously diagnosed with manic depression Hannah ADHD.  She was prescribed medications, but she does not like to take them Hannah has not taken them in some time.  She reports multiple inpatient behavioral health admissions when the patient was living in [place].  Reports the patient really has not eaten in 4 days.  She has not been sleeping well over the last few days.  The patient's mother notes that she has seemed very paranoid.  She gives the example that she seems to "jump" if you move towards or too fast.   Also notes that she has been "jumpy" with loud noises.  Patient's mother reports that she seems to have been very overprotective of her daughter over the last few days.  She also notes that the patient has been signing with her hands instead of talking to respond? [name] states "if you tell her to do something she will do the opposite". [name] denies that she has heard the patient endorses SI, HI.  Reports that she seems to have been hallucinating.  She was last seen by primary care in January 2020.  The patient relocated from [place] 5 months ago.  This is an initial appointment with a provider in [place].  She been diagnosed with Armstrong in 2014.  She takes Synthroid.  She was noted to have been donating plasma weekly.  She was endorsing a headache that moved all over with photophobia Hannah phonophobia.  Headache comes on suddenly Hannah can last for several days.  She was noted to smoke approximately 2 to 3 cigarettes/day.  She was started on Topamax Hannah Imitrex for Hannah Armstrong.  No behavioral health history was documented at this visit.  The patient does not answer questions regarding alcohol use, IV, recreational drug use.  Level V caveat secondary to psychiatric disorder.      The history is provided by the patient. No language interpreter was used.    History reviewed. No pertinent past medical history.  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      Home Medications    Prior to Admission medications   Medication Sig Start Date End Date Taking? Authorizing Provider  levothyroxine  (SYNTHROID, LEVOTHROID) 75 MCG tablet Take 1 tablet (75 mcg total) by mouth daily before breakfast. 07/10/18   Marcine MatarJohnson, Deborah B, MD  SUMAtriptan (IMITREX) 50 MG tablet Take 1 tab at the start of the headache.  May repeat in 2 hours if no relief.  Max 2 tabs/24 hours 07/10/18   Marcine MatarJohnson, Deborah B, MD  topiramate (TOPAMAX) 25 MG tablet Take 1 tablet (25 mg total) by mouth at bedtime. 07/10/18   Marcine MatarJohnson, Deborah B, MD    Family History Family History  Problem Relation Age of Onset   Hypertension Mother    Breast cancer Sister    Diabetes Maternal Aunt    Sickle cell anemia Paternal Aunt    Breast cancer Paternal Aunt     Social History Social History   Tobacco Use   Smoking status: Current Some Day Smoker    Packs/day: 0.25    Years: 16.00    Pack years: 4.00    Types: Cigarettes   Smokeless tobacco: Never Used  Substance Use Topics   Alcohol use: Not Currently   Drug use: Yes    Comment: CBD - hump     Allergies   Patient has no known allergies.   Review of Systems Review of Systems  Unable to perform ROS: Psychiatric disorder     Physical Exam Updated Vital Signs BP 132/89 (BP Location: Right Arm)    Pulse 84    Temp 98.4 F (36.9 C) (Oral)    Resp 18    Ht 5\' 6"  (1.676 m)    Wt 104.3 kg    SpO2 100%    BMI 37.12 kg/m   Physical Exam Vitals signs Hannah nursing note reviewed.  Constitutional:      General: She is not in acute distress.    Appearance: She is obese.     Comments: Patient will sit in silence for long periods of time Hannah stare at her lap without answering questions.  HENT:     Head: Normocephalic.  Eyes:     Conjunctiva/sclera: Conjunctivae normal.  Neck:     Musculoskeletal: Neck supple.  Cardiovascular:     Rate Hannah Rhythm: Normal rate Hannah regular rhythm.     Pulses: Normal pulses.     Heart sounds: Normal heart sounds. No murmur. No friction rub. No gallop.   Pulmonary:     Effort: Pulmonary effort is normal. No respiratory  distress.     Breath sounds: No stridor. No wheezing, rhonchi or rales.  Chest:     Chest wall: No tenderness.  Abdominal:     General: There is no distension.     Palpations: Abdomen is soft. There is no mass.     Tenderness: There is no abdominal tenderness. There is no right CVA tenderness, left CVA tenderness, guarding or rebound.     Hernia: No hernia is present.  Skin:    General: Skin is warm.     Findings: No rash.  Neurological:  Mental Status: She is alert.     Comments: She is oriented to her first Hannah last name.  Reports the [place]  She can correctly identify the president.  Initially states the year is 2018.  When asked the month, she states "Happy Mother's Day". She does not answer when asked the date initially. She then states that the month Hannah day are February 14.   Psychiatric:        Behavior: Behavior normal.        Thought Content: Thought content includes suicidal ideation.      ED Treatments / Results  Labs (all labs ordered are listed, but only abnormal results are displayed) Labs Reviewed  COMPREHENSIVE METABOLIC PANEL - Abnormal; Notable for the following components:      Result Value   Potassium 3.3 (*)    Glucose, Bld 105 (*)    All other components within normal limits  ACETAMINOPHEN LEVEL - Abnormal; Notable for the following components:   Acetaminophen (Tylenol), Serum <10 (*)    All other components within normal limits  CBC - Abnormal; Notable for the following components:   WBC 13.0 (*)    All other components within normal limits  RAPID URINE DRUG SCREEN, HOSP PERFORMED - Abnormal; Notable for the following components:   Amphetamines POSITIVE (*)    Tetrahydrocannabinol POSITIVE (*)    All other components within normal limits  ETHANOL  SALICYLATE LEVEL  PREGNANCY, URINE    EKG None  Radiology No results found.  Procedures Procedures (including critical care time)  Medications Ordered in ED Medications    nicotine (NICODERM CQ - dosed in mg/24 hours) patch 21 mg (has no administration in time range)  acetaminophen (TYLENOL) tablet 650 mg (has no administration in time range)  ondansetron (ZOFRAN) tablet 4 mg (has no administration in time range)  LORazepam (ATIVAN) injection 1 mg (1 mg Intramuscular Given 10/23/18 0558)     Initial Impression / Assessment Hannah Plan / ED Course  I have reviewed the triage vital signs Hannah the nursing notes.  Pertinent labs & imaging results that were available during my care of the patient were reviewed by me Hannah considered in my medical decision making (see chart for details).        38 year old female with history of Armstrong, but tobacco use disorder brought in by [employer] under IVC.  History is limited as the patient refuses to answer most questions.  She does endorse suicidal ideation to me.  She denies HI.  She will not answer when asked if she has a suicidal plan or auditory visual hallucinations. First examination form has been completed.  UDS positive for amphetamines Hannah THC. Labs are otherwise reassuring.  Pt medically cleared at this time. Psych hold orders Hannah home med orders placed. TTS consult pending; please see psych team notes for further documentation of care/dispo. Pt stable at time of med clearance.    Patient care transferred to PA Couture at the end of my shift. Patient presentation, ED course, Hannah plan of care discussed with review of all pertinent labs Hannah imaging. Please see his/her note for further details regarding further ED course Hannah disposition.  Final Clinical Impressions(s) / ED Diagnoses   Final diagnoses:  None    ED Discharge Orders    None       Yeilin Zweber A, PA-C 10/23/18 0340    Derwood Kaplan, MD 10/23/18 2332

## 2018-10-23 NOTE — ED Notes (Signed)
Spitting periodically into the garbage cans, no other sx noted of a cold. Wandering the unit, security has to remind her to stay in her room or near it, and security person is using his body and size to block her from going where she is not to go. She is not aggressive. Is slow to respond to questions or directions, possibly hallucinating.

## 2018-10-23 NOTE — ED Notes (Signed)
Pt belongings placed in one white bag and placed under the ice machine at the nurse's station.

## 2018-10-23 NOTE — H&P (Addendum)
Behavioral Health Medical Screening Exam  Hannah Armstrong is an 38 y.o. female who presented to [facility] with law enforcement under IVC. Patient appears anxious and paranoid. Responded very little. However, she did nod no when asked about overdose, self-injury, substance abuse, and medical issues.     Psychiatric Specialty Exam: Physical Exam  Constitutional: She appears well-developed and well-nourished. No distress.  HENT:  Head: Normocephalic and atraumatic.  Right Ear: External ear normal.  Left Ear: External ear normal.  Eyes: Pupils are equal, round, and reactive to light.  Respiratory: Effort normal. No respiratory distress.  Musculoskeletal: Normal range of motion.  Neurological: She is alert.  Skin: She is not diaphoretic.  Psychiatric: Her mood appears anxious. She is withdrawn. Thought content is paranoid.    Review of Systems  Unable to perform ROS: Psychiatric disorder    Blood pressure (!) 156/94, pulse 87, temperature 98.4 F (36.9 C), temperature source Oral, resp. rate 18, height 5\' 6"  (1.676 m), weight 104.3 kg, SpO2 100 %.Body mass index is 37.12 kg/m.      Blood pressure (!) 156/94, pulse 87, temperature 98.4 F (36.9 C), temperature source Oral, resp. rate 18, height 5\' 6"  (1.676 m), weight 104.3 kg, SpO2 100 %.  Recommendations:  Based on my evaluation the patient does not appear to have an emergency medical condition.   No adult observation beds available at [facility] per [facility] will be sent to ED via law enforcement.  Jackelyn Poling, NP 10/23/2018, 6:23 AM

## 2018-10-23 NOTE — ED Notes (Signed)
Pt rambling, tangential.pt making bizarre gestures at times.  Pt anxious, needs continuous redirection. Agitated, wants to go home. Pt can be redirected

## 2018-10-23 NOTE — ED Notes (Signed)
Pt not answering questions at time of triage. Pt given scrubs to change into at this time while [employer] is in department.

## 2018-10-23 NOTE — ED Notes (Signed)
Report called to Elizabeth RN

## 2018-10-23 NOTE — ED Provider Notes (Signed)
Pt care assumed from Kindred HealthcareMia McDonald, PA-C at shift change pending TTS eval. Please see her note for full H&P.  Per her note, "Hannah DeputyShanta McCaskill-Johnson is a 38 y.o. female with a history of acquired hypothyroidism, migraines, morbid obesity, and tobacco use disorder who presents to the emergency department accompanied by police under IVC.  The patient had IVC paperwork taken out by her mother.  Per IVC paperwork the patient's mother states respondent is ADHD.  She is hearing voices coming out the TV.  She has hearing phones ring that are not there.  [name] is standing over people while they sleep.  She is walking back and forth.  [name] needs to be evaluated for possible mental illness.  On my evaluation, the patient endorses suicidal ideation.  When asked if she has a plan, she does not answer.  She denies homicidal ideation.  When asked if she has been having auditory or visual hallucinations, the patient does not answer.  Per chart review, on 09/26/2018, there is a telephone conversation where the patient called to check on the status of her labs.  Patient was concerned if she will pass away tonight and was requesting a call back.  Spoke with the patient's mother, Michaelene SongLetha Wilson. History is somewhat limited due to the quality of the reception during the phone call.  Reports that the patient was previously diagnosed with manic depression and ADHD.  She was prescribed medications, but she does not like to take them and has not taken them in some time.  She reports multiple inpatient behavioral health admissions when the patient was living in [place].  Reports the patient really has not eaten in 4 days.  She has not been sleeping well over the last few days.  The patient's mother notes that she has seemed very paranoid.  She gives the example that she seems to "jump" if you move towards or too fast.  Also notes that she has been "jumpy" with loud noises.  Patient's mother reports that she seems to  have been very overprotective of her daughter over the last few days.  She also notes that the patient has been signing with her hands instead of talking to respond? Elson ClanLetha states "if you tell her to do something she will do the opposite". Elson ClanLetha denies that she has heard the patient endorses SI, HI.  Reports that she seems to have been hallucinating.  She was last seen by primary care in January 2020.  The patient relocated from [place] 5 months ago.  This is an initial appointment with a [place] in [place].  She been diagnosed with hypothyroidism in 2014.  She takes Synthroid.  She was noted to have been donating plasma weekly.  She was endorsing a headache that moved all over with photophobia and phonophobia.  Headache comes on suddenly and can last for several days.  She was noted to smoke approximately 2 to 3 cigarettes/day.  She was started on Topamax and Imitrex for migraines.  No behavioral health history was documented at this visit.  The patient does not answer questions regarding alcohol use, IV, recreational drug use.  Level V caveat secondary to psychiatric disorder. "   Physical Exam  BP 132/89 (BP Location: Right Arm)   Pulse 84   Temp 98.4 F (36.9 C) (Oral)   Resp 18   Ht 5\' 6"  (1.676 m)   Wt 104.3 kg   SpO2 100%   BMI 37.12 kg/m   Physical Exam Vitals signs and nursing  note reviewed.  Constitutional:      General: She is not in acute distress.    Appearance: She is well-developed.  HENT:     Head: Normocephalic and atraumatic.  Eyes:     Conjunctiva/sclera: Conjunctivae normal.  Neck:     Musculoskeletal: Neck supple.  Cardiovascular:     Rate and Rhythm: Normal rate.  Pulmonary:     Effort: Pulmonary effort is normal.  Musculoskeletal: Normal range of motion.  Skin:    General: Skin is warm and dry.  Neurological:     Mental Status: She is alert.     Comments: Pt ambulating down the hall.   Psychiatric:     Comments: Uncooperative. Refuses to  answer questions or be evaluated.      ED Course/Procedures     Procedures  Results for orders placed or performed during the hospital encounter of 10/23/18  Comprehensive metabolic panel  Result Value Ref Range   Sodium 136 135 - 145 mmol/L   Potassium 3.3 (L) 3.5 - 5.1 mmol/L   Chloride 103 98 - 111 mmol/L   CO2 23 22 - 32 mmol/L   Glucose, Bld 105 (H) 70 - 99 mg/dL   BUN 9 6 - 20 mg/dL   Creatinine, Ser 0.98 0.44 - 1.00 mg/dL   Calcium 9.0 8.9 - 11.9 mg/dL   Total Protein 8.1 6.5 - 8.1 g/dL   Albumin 4.9 3.5 - 5.0 g/dL   AST 33 15 - 41 U/L   ALT 30 0 - 44 U/L   Alkaline Phosphatase 59 38 - 126 U/L   Total Bilirubin 1.0 0.3 - 1.2 mg/dL   GFR calc non Af Amer >60 >60 mL/min   GFR calc Af Amer >60 >60 mL/min   Anion gap 10 5 - 15  Ethanol  Result Value Ref Range   Alcohol, Ethyl (B) <10 <10 mg/dL  Salicylate level  Result Value Ref Range   Salicylate Lvl <7.0 2.8 - 30.0 mg/dL  Acetaminophen level  Result Value Ref Range   Acetaminophen (Tylenol), Serum <10 (L) 10 - 30 ug/mL  cbc  Result Value Ref Range   WBC 13.0 (H) 4.0 - 10.5 K/uL   RBC 4.07 3.87 - 5.11 MIL/uL   Hemoglobin 12.4 12.0 - 15.0 g/dL   HCT 14.7 82.9 - 56.2 %   MCV 94.3 80.0 - 100.0 fL   MCH 30.5 26.0 - 34.0 pg   MCHC 32.3 30.0 - 36.0 g/dL   RDW 13.0 86.5 - 78.4 %   Platelets 288 150 - 400 K/uL   nRBC 0.0 0.0 - 0.2 %  Rapid urine drug screen (hospital performed)  Result Value Ref Range   Opiates NONE DETECTED NONE DETECTED   Cocaine NONE DETECTED NONE DETECTED   Benzodiazepines NONE DETECTED NONE DETECTED   Amphetamines POSITIVE (A) NONE DETECTED   Tetrahydrocannabinol POSITIVE (A) NONE DETECTED   Barbiturates NONE DETECTED NONE DETECTED  Pregnancy, urine  Result Value Ref Range   Preg Test, Ur NEGATIVE NEGATIVE   No results found.   MDM   38 y/o female presenting to the ED under IVC after mother took out paperwork due to hallucinations and abnormal behavior. Pt also reporting SI. Pt  reluctant to answer questions regarding details about SI, HI, or AVH.   Reviewed screening psych labs, which are notable for mild leukocytosis and mild hypokalemia.  Negative ETOH, acetaminophen, and tylenol levels. UDS + for amphetamines and THC.   Labs over all are reassuring and patient  is medically stable for TTS eval.   Behavioral health evaluated the pt and recommends inpatient treatment. COVID testing ordered for screening. Home medications ordered.   Rayne Du 10/23/18 1612    Azalia Bilis, MD 10/24/18 [phone]

## 2018-10-23 NOTE — ED Notes (Signed)
Patient finally took po medication after initially saying she would not take it.

## 2018-10-23 NOTE — ED Notes (Signed)
IM Ativan given 30 min ago, seems slightly slowed down, is verbal and appropriate at times, the rest of the time she is mute. She asked for some clarity on what her vitals meant. Vitals in normal limits since getting Ativan, higher prior to it.

## 2018-10-23 NOTE — ED Notes (Signed)
Pt yelling, pulled up scrub shirt, bizarre hand gestures continue.

## 2018-10-23 NOTE — ED Notes (Signed)
Standing in hall making gestures which appear out of place to this Clinical research associate. Saluting, repeatedly hitting her chest with a karate type gesture, making a symbol with her two fingers at her shoulder. Ativan has not made a significant difference or benefit to this point.

## 2018-10-23 NOTE — ED Notes (Signed)
Report called to [facility] at [facility].

## 2018-10-23 NOTE — [facility] Suicide Risk Assessment (Signed)
[facility] Admission Suicide Risk Assessment   Total Time spent with patient: 45 minutes Principal Problem: Disorganized thought and behavior propelled by psychotic disorder and drug screen positive for amphetamines and cannabis Diagnosis:  Active Problems:   Schizoaffective disorder, bipolar type (HCC)  Subjective Data: Patient poorly cooperative raises her hands in the air makes hyper religious statements refused to cooperate needs constant redirection to be safe at this point in time  Continued Clinical Symptoms:    The "Alcohol Use Disorders Identification Test", Guidelines for Use in Primary Care, Second Edition.  World Science writer [facility]). Score between 0-7:  no or low risk or alcohol related problems. Score between 8-15:  moderate risk of alcohol related problems. Score between 16-19:  high risk of alcohol related problems. Score 20 or above:  warrants further diagnostic evaluation for alcohol dependence and treatment.   CLINICAL FACTORS:   Schizophrenia:   Less than 60 years old   Musculoskeletal: Strength & Muscle Tone: within normal limits Gait & Station: normal Patient leans: N/A  Psychiatric Specialty Exam: Physical Exam will not allow fuller physical than what is already been done  ROS illogical negative cardiovascular negative GI GU negative but not fully understanding question as to the level of psychosis  There were no vitals taken for this visit.There is no height or weight on file to calculate BMI.  General Appearance: Casual and Disheveled  Eye Contact:  Minimal  Speech:  Pressured  Volume:  Increased  Mood:  Irritable and Manic  Affect:  Non-Congruent and Labile  Thought Process:  Disorganized, Irrelevant and Descriptions of Associations: Loose  Orientation:  Full (Time, Place, and Person) in general person and the fact that she is hospitalized not exact date  Thought Content:  Illogical, Delusions and Hallucinations: Auditory  Suicidal Thoughts:  No   Homicidal Thoughts:  No  Memory:  Immediate;   Poor  Judgement:  Impaired  Insight:  Lacking  Psychomotor Activity: Agitated and self agitating  Concentration:  Concentration: Poor  Recall:  Poor  Fund of Knowledge:  Poor  Language:  Poor  Akathisia:  Negative  Handed:  Right  AIMS (if indicated):     Assets:  Physical Health Resilience  ADL's:  Intact  Cognition:  Impaired,  Moderate  Sleep:         COGNITIVE FEATURES THAT CONTRIBUTE TO RISK:  Closed-mindedness and Loss of executive function    SUICIDE RISK:   Minimal: No identifiable suicidal ideation.  Patients presenting with no risk factors but with morbid ruminations; may be classified as minimal risk based on the severity of the depressive symptoms  PLAN OF CARE: Medication as needed acutely and then long-term  I certify that inpatient services furnished can reasonably be expected to improve the patient's condition.   Malvin Johns, MD 10/23/2018, 3:35 PM

## 2018-10-23 NOTE — Progress Notes (Signed)
Admission Note: Patient had to be escorted to the unit with show of support.  Patient appears to be disorganized, paranoid and suspicious.  Refused to participate in admission process and assessment.  While on the unit patient was standing in front of the double door.  Requesting and demanding to sign discharge paperwork.  Patient redirected away from the door several times but refused.  Geodon 20 mg and Ativan 4 mg IM given as prescribed.  Patient is in her room sleeping.  Routine safety checks maintained every 15 minutes.  Patient is safe on the unit.

## 2018-10-23 NOTE — BH Assessment (Signed)
Received TTS consult request. Spoke to RN who said Pt is unable to participate in assessment at this time and has been given medication. Day shift TTS will be notified.   Pamalee Leyden, [facility], [facility], [facility] Triage Specialist [phone]

## 2018-10-23 NOTE — ED Notes (Signed)
Pt wanded by security at this time and [employer] released from observation.

## 2018-10-23 NOTE — ED Notes (Signed)
Transferred from ED to room 29 for ongoing monitoring of needs and safety. She walked onto the unit backwards and required direction from security to be in room. Appears very anxious and frightened. Offers little verbally but did ask writer to repeat what I was giving her. She has a shot ordered of Ativan and she did take the shot with very little support from security other than they held her arm still. Tears in her eyes but wont tell Clinical research associate what is wrong or affirm Product manager suggests, ie are you scared? Nervous? She was given much reassurance. Gave her food and drink which she took and ate. Sitter needed, AC notified of need for sitter. Security with her now and are required to keep her in her room and end of the hall, wandering.

## 2018-10-23 NOTE — ED Notes (Signed)
Pt pacing in triage area. Mia PA notified for additional orders for needed medications.

## 2018-10-23 NOTE — BH Assessment (Signed)
[facility] Assessment Progress Note  Per Juanetta Beets, DO, this pt requires psychiatric hospitalization.  Berneice Heinrich, RN, [facility] has assigned pt to [facility] Rm 505-2; [facility] will be ready to receive pt at 15:00.  Pt presents under IVC initiated by pt's mother, and upheld by EDP Derwood Kaplan, MD, and IVC documents have been faxed to [facility].  Pt's nurse, Waynetta Sandy, has been notified, and agrees to call report to [phone].  Pt is to be transported via Patent [employer].   Doylene Canning, [place] Behavioral Health Coordinator [phone]

## 2018-10-23 NOTE — ED Notes (Signed)
Police called for transport. 

## 2018-10-23 NOTE — ED Notes (Signed)
Patient continues to stand outside of room talking to staff with disorganized thinking, making arm and hand gestures.  Patient will not return to room when asked.  Security on standby.  Sitter watching patient.

## 2018-10-23 NOTE — BH Assessment (Signed)
[facility] Assessment Progress Note   Case was staffed with Norman DO who recommended a inpatient admission to assist with stabilization.    

## 2018-10-23 NOTE — ED Notes (Signed)
Patient is IVC 

## 2018-10-23 NOTE — BH Assessment (Addendum)
Assessment Note  Hannah Armstrong is an 38 y.o. female that presents this date with IVC. Per IVC: "[name] is ADHD, she is responding to voices that are coming out of the TV when it isn't on. She is hearing a phone ring that isn't there, [name] is standing over family members when they sleep." This writer spoke with patient at 0900 hours and observed the patient to be very disorganized and unable to render history. Patient denies any S/I, H/I or AVH although this writer is unsure if patient is comprehending the content of this writer's questions. Patient is speaking in word salad at times and is very animated drawing pictures in the air with her finger. This writer attempts to redirect unsuccessfully. Patient is very disorganized and keeps walking out of her room into the hallway as this writer attempts to assess. This writer attempted to contact patient's mother Hannah Armstrong [phone] unsuccessfully to gather collateral information. Information to complete assessment was obtained from admission notes and provider who spoke with mother on admission. Per notes, Patient presents with a history of acquired hypothyroidism, migraines, morbid obesity, and tobacco use disorder who presents to the emergency department accompanied by police under IVC. The patient had IVC paperwork taken out by her mother. Per IVC paperwork the patient's mother states respondent is ADHD. She is hearing voices coming out the TV. She is hearing phones ring that are not there. [name] is standing over people while they sleep. She is walking back and forth. [name] needs to be evaluated for possible mental illness. Patient endorses suicidal ideation. When asked if she has a plan, she does not answer. She denies homicidal ideation. When asked if she has been having auditory or visual hallucinations, the patient does not answer. EDP spoke with patient's mother on admission. Per that note, "History is somewhat limited due  to the quality of the reception during the phone call.  Reports that the patient was previously diagnosed with manic depression and ADHD.  She was prescribed medications, but she does not like to take them and has not taken them in some time. She reports multiple inpatient behavioral health admissions when the patient was living in [place]. Reports the patient really has not eaten in 4 days. She has not been sleeping well over the last few days. The patient's mother notes that she has seemed very paranoid. She gives the example that she seems to "jump" if you move towards or too fast. Also notes that she has been "jumpy" with loud noises. Patient's mother reports that she seems to have been very overprotective of her daughter over the last few days. She also notes that the patient has been signing with her hands instead of talking to respond. Hannah Armstrong states "if you tell her to do something she will do the opposite". Hannah Armstrong denies that she has heard the patient endorses SI, HI.  Reports that she seems to have been hallucinating. She was last seen by primary care in January 2020. The patient relocated from [place] 5 months ago. This is an initial appointment with a provider in [place]. She has been diagnosed with hypothyroidism in 2014. She takes Synthroid. She was noted to have been donating plasma weekly. She was endorsing a headache that moved all over with photophobia and phonophobia. No behavioral health history was documented at this visit". Case was staffed with Hannah Covert DO who recommended a inpatient admission to assist with stabilization.   Diagnosis: Unspecified psychosis  Past Medical History: History reviewed. No pertinent past  medical history.  Past Surgical History:  Procedure Laterality Date  . ANTERIOR CRUCIATE LIGAMENT (ACL) REVISION Right 2016 Nov    Family History:  Family History  Problem Relation Age of Onset  . Hypertension Mother   . Breast cancer Sister   . Diabetes Maternal  Aunt   . Sickle cell anemia Paternal Aunt   . Breast cancer Paternal Aunt     Social History:  reports that she has been smoking cigarettes. She has a 4.00 pack-year smoking history. She has never used smokeless tobacco. She reports previous alcohol use. She reports current drug use.  Additional Social History:  Alcohol / Drug Use Pain Medications: See MAR Prescriptions: See MAR Over the Counter: See MAR History of alcohol / drug use?: Yes Longest period of sobriety (when/how long): NA Negative Consequences of Use: (NA) Withdrawal Symptoms: (NA) Substance #1 Name of Substance 1: Cannabis per hx UDS positive 1 - Age of First Use: Pt denies current use 1 - Amount (size/oz): UTA 1 - Frequency: UTA 1 - Duration: UTA 1 - Last Use / Amount: UTA UDS positive this date  CIWA: CIWA-Ar BP: 132/89 Pulse Rate: 84 COWS:    Allergies: No Known Allergies  Home Medications: (Not in a hospital admission)   OB/GYN Status:  No LMP recorded.  General Assessment Data Location of Assessment: [facility] TTS Assessment: In system Is this a Tele or Face-to-Face Assessment?: Face-to-Face Is this an Initial Assessment or a Re-assessment for this encounter?: Initial Assessment Patient Accompanied by:: (NA) Language Other than English: No Living Arrangements: Other (Comment)(Parents) What gender do you identify as?: Female Marital status: Single Pregnancy Status: Unknown Living Arrangements: Parent Can pt return to current living arrangement?: Yes Admission Status: Involuntary Petitioner: Family member Is patient capable of signing voluntary admission?: No Referral Source: Self/Family/Friend Insurance type: Medicaid  Medical Screening Exam [facility] Walk-in ONLY) Medical Exam completed: Yes  Crisis Care Plan Living Arrangements: Parent Legal Guardian: (NA) Name of Psychiatrist: UTA Name of Therapist: UTA  Education Status Is patient currently in school?: No Is the patient employed,  unemployed or receiving disability?: Receiving disability income  Risk to self with the past 6 months Suicidal Ideation: No Has patient been a risk to self within the past 6 months prior to admission? : No Suicidal Intent: No Has patient had any suicidal intent within the past 6 months prior to admission? : No Is patient at risk for suicide?: No, but patient needs Medical Clearance Suicidal Plan?: No Has patient had any suicidal plan within the past 6 months prior to admission? : No Access to Means: No What has been your use of drugs/alcohol within the last 12 months?: NA Previous Attempts/Gestures: No How many times?: 0 Other Self Harm Risks: (UTA) Triggers for Past Attempts: (UTA) Intentional Self Injurious Behavior: (UTA) Family Suicide History: (UTA) Recent stressful life event(s): (UTA) Persecutory voices/beliefs?: Yes(Per IVC) Depression: No Depression Symptoms: (Pt denies) Substance abuse history and/or treatment for substance abuse?: No Suicide prevention information given to non-admitted patients: Not applicable  Risk to Others within the past 6 months Homicidal Ideation: No Does patient have any lifetime risk of violence toward others beyond the six months prior to admission? : No Thoughts of Harm to Others: No Current Homicidal Intent: No Current Homicidal Plan: No Access to Homicidal Means: No Identified Victim: NA History of harm to others?: (UTA) Assessment of Violence: On admission Violent Behavior Description: Aggressive on admission Does patient have access to weapons?: (UTA) Criminal Charges Pending?: (UTA)  Does patient have a court date: (UTA) Is patient on probation?: (UTA)  Psychosis Hallucinations: Auditory, Visual Delusions: Persecutory  Mental Status Report Appearance/Hygiene: In scrubs Eye Contact: Fair Motor Activity: Freedom of movement Speech: Soft, Slow, Word salad Level of Consciousness: Irritable Mood: Anxious Affect: Inconsistent  with thought content Anxiety Level: Moderate Thought Processes: Flight of Ideas, Thought Blocking Judgement: Impaired Orientation: Unable to assess Obsessive Compulsive Thoughts/Behaviors: Unable to Assess  Cognitive Functioning Concentration: Unable to Assess Memory: Unable to Assess Is patient IDD: No Insight: Unable to Assess Impulse Control: Unable to Assess Appetite: (UTA) Have you had any weight changes? : (UTA) Sleep: (UTA) Total Hours of Sleep: (UTA) Vegetative Symptoms: (UTA)  ADLScreening [facility]) Patient's cognitive ability adequate to safely complete daily activities?: Yes Patient able to express need for assistance with ADLs?: Yes Independently performs ADLs?: Yes (appropriate for developmental age)  Prior Inpatient Therapy Prior Inpatient Therapy: Yes(Per note review) Prior Therapy Dates: 2019(Per note review) Prior Therapy Facilty/Provider(s[place] in [place]  Reason for Treatment: MH issues  Prior Outpatient Therapy Prior Outpatient Therapy: Yes(per note review) Prior Therapy Dates: 2019 [place] limited hx Reason for Treatment: Med mang Does patient have an ACCT team?: No Does patient have Intensive In-House Services?  : No Does patient have [facility] services? : Unknown Does patient have P4CC services?: No  ADL Screening (condition at time of admission) Patient's cognitive ability adequate to safely complete daily activities?: Yes Is the patient deaf or have difficulty hearing?: No Does the patient have difficulty seeing, even when wearing glasses/contacts?: No Does the patient have difficulty concentrating, remembering, or making decisions?: Yes Patient able to express need for assistance with ADLs?: Yes Does the patient have difficulty dressing or bathing?: No Independently performs ADLs?: Yes (appropriate for developmental age) Does the patient have difficulty walking or climbing stairs?:  No Weakness of Legs: None Weakness of Arms/Hands: None  Home Assistive Devices/Equipment Home Assistive Devices/Equipment: None  Therapy Consults (therapy consults require a physician order) PT Evaluation Needed: No OT Evalulation Needed: No SLP Evaluation Needed: No Abuse/Neglect Assessment (Assessment to be complete while patient is alone) Physical Abuse: Denies Verbal Abuse: Denies Sexual Abuse: Denies Exploitation of patient/patient's resources: Denies Self-Neglect: Denies Values / Beliefs Cultural Requests During Hospitalization: None Spiritual Requests During Hospitalization: None Consults Spiritual Care Consult Needed: No Social Work Consult Needed: No Merchant navy officerAdvance Directives (For Healthcare) Does Patient Have a Medical Advance Directive?: No Would patient like information on creating a medical advance directive?: No - Patient declined          Disposition: Case was staffed with Hannah CovertNorman DO who recommended a inpatient admission to assist with stabilization.  Disposition Initial Assessment Completed for this Encounter: Yes Disposition of Patient: Admit Type of inpatient treatment program: Adult Patient refused recommended treatment: No Mode of transportation if patient is discharged/movement?: (Unk )  On Site Evaluation by:   Reviewed with Physician:    Alfredia Fergusonavid L Lerae Langham 10/23/2018 9:37 AM

## 2018-10-23 NOTE — Progress Notes (Signed)
D: Pt visible on unit some this evening. Pt not given her Haldol due to pt stating she has a reaction, but would not elaborate.   A: Pt was offered support and encouragement.  Pt was encourage to attend groups. Q 15 minute checks were done for safety.   R: safety maintained on unit.

## 2018-10-24 MED ORDER — HALOPERIDOL 5 MG PO TABS
7.0000 mg | ORAL_TABLET | Freq: Three times a day (TID) | ORAL | Status: DC
  Filled 2018-10-24 (×9): qty 1

## 2018-10-24 NOTE — Progress Notes (Signed)
[place] NOVEL CORONAVIRUS (COVID-19) DAILY CHECK-OFF SYMPTOMS - answer yes or no to each - every day NO YES  Have you had a fever in the past 24 hours?  . Fever (Temp > 37.80C / 100F) X   Have you had any of these symptoms in the past 24 hours? . New Cough .  Sore Throat  .  Shortness of Breath .  Difficulty Breathing .  Unexplained Body Aches   X   Have you had any one of these symptoms in the past 24 hours not related to allergies?   . Runny Nose .  Nasal Congestion .  Sneezing   X   If you have had runny nose, nasal congestion, sneezing in the past 24 hours, has it worsened?  X   EXPOSURES - check yes or no X   Have you traveled outside the state in the past 14 days?  X   Have you been in contact with someone with a confirmed diagnosis of COVID-19 or PUI in the past 14 days without wearing appropriate PPE?  X   Have you been living in the same home as a person with confirmed diagnosis of COVID-19 or a PUI (household contact)?    X   Have you been diagnosed with COVID-19?    X              What to do next: Answered NO to all: Answered YES to anything:   Proceed with unit schedule Follow the [facility] Inpatient Flowsheet.   

## 2018-10-24 NOTE — Progress Notes (Signed)
DAR NOTE: Patient presents with anxious affect and irritable mood.  Denies suicidal thoughts, pain, auditory and visual hallucinations.  Rates depression at 2, hopelessness at 1, and anxiety at 1.  Maintained on routine safety checks.  Medications given as prescribed but refused Haldol. States she is allergic to it. Support and encouragement offered as needed.  Attended group and participated.  States goal for today is "discharge."  Patient observed pacing the hallway.  Argumentative with staff about being held against her will.  Appears to be preoccupied and responding to internal stimuli.  Patient is safe on the unit.

## 2018-10-24 NOTE — Progress Notes (Signed)
[facility] MD Progress Note  10/24/2018 10:33 AM Hannah Armstrong  MRN:  [id number] Subjective:  Before team meeting on preliminary rounds and just status check with me she was argumentative and irritable demanding to go stating she did not need treatment, after rounds stating she had talked to her mother, she knows she is on disability for unspecified medical reasons but she basically states that she is sorry for talking over me and not letting me finish the interview and states she will comply with meds but she has yet to fully comply.  No EPS or TD. So as her form of her schizoaffective she is gone from irritability to humility fairly quickly but I think this is more just illness then genuine mood state Principal Problem: Untreated schizoaffective disorder Diagnosis: Active Problems:   Schizoaffective disorder, bipolar type (HCC)   Schizoaffective disorder (HCC)  Total Time spent with patient: 30 minutes  Past Psychiatric History: see eval  Past Medical History: History reviewed. No pertinent past medical history.  Past Surgical History:  Procedure Laterality Date  . ANTERIOR CRUCIATE LIGAMENT (ACL) REVISION Right 2016 Nov   Family History:  Family History  Problem Relation Age of Onset  . Hypertension Mother   . Breast cancer Sister   . Diabetes Maternal Aunt   . Sickle cell anemia Paternal Aunt   . Breast cancer Paternal Aunt    Family Psychiatric  History: see eval Social History:  Social History   Substance and Sexual Activity  Alcohol Use Not Currently     Social History   Substance and Sexual Activity  Drug Use Yes   Comment: CBD - hump    Social History   Socioeconomic History  . Marital status: Unknown    Spouse name: Not on file  . Number of children: Not on file  . Years of education: Not on file  . Highest education level: Not on file  Occupational History  . Not on file  Social Needs  . Financial resource strain: Not on file  . Food insecurity:     Worry: Not on file    Inability: Not on file  . Transportation needs:    Medical: Not on file    Non-medical: Not on file  Tobacco Use  . Smoking status: Current Some Day Smoker    Packs/day: 0.25    Years: 16.00    Pack years: 4.00    Types: Cigarettes  . Smokeless tobacco: Never Used  Substance and Sexual Activity  . Alcohol use: Not Currently  . Drug use: Yes    Comment: CBD - hump  . Sexual activity: Not on file  Lifestyle  . Physical activity:    Days per week: Not on file    Minutes per session: Not on file  . Stress: Not on file  Relationships  . Social connections:    Talks on phone: Not on file    Gets together: Not on file    Attends religious service: Not on file    Active member of club or organization: Not on file    Attends meetings of clubs or organizations: Not on file    Relationship status: Not on file  Other Topics Concern  . Not on file  Social History Narrative  . Not on file   Additional Social History:                         Sleep: Fair  Appetite:  Fair  Current  Medications: Current Facility-Administered Medications  Medication Dose Route Frequency Provider Last Rate Last Dose  . acetaminophen (TYLENOL) tablet 650 mg  650 mg Oral Q6H PRN Malvin Johns, MD   650 mg at 10/24/18 0408  . alum & mag hydroxide-simeth (MAALOX/MYLANTA) 200-200-20 MG/5ML suspension 30 mL  30 mL Oral Q4H PRN Malvin Johns, MD      . benztropine (COGENTIN) tablet 0.5 mg  0.5 mg Oral BID Malvin Johns, MD   0.5 mg at 10/24/18 0807  . clonazePAM (KLONOPIN) tablet 1 mg  1 mg Oral TID Malvin Johns, MD   1 mg at 10/24/18 1610  . haloperidol (HALDOL) tablet 7 mg  7 mg Oral TID Malvin Johns, MD      . hydrOXYzine (ATARAX/VISTARIL) tablet 50 mg  50 mg Oral TID PRN Malvin Johns, MD      . levothyroxine (SYNTHROID) tablet 50 mcg  50 mcg Oral Q0600 Malvin Johns, MD      . magnesium hydroxide (MILK OF MAGNESIA) suspension 30 mL  30 mL Oral Daily PRN Malvin Johns, MD       . propranolol (INDERAL) tablet 20 mg  20 mg Oral BID Malvin Johns, MD   20 mg at 10/24/18 0807  . temazepam (RESTORIL) capsule 30 mg  30 mg Oral QHS Malvin Johns, MD   30 mg at 10/23/18 2151  . traZODone (DESYREL) tablet 150 mg  150 mg Oral QHS PRN Malvin Johns, MD        Lab Results:  Results for orders placed or performed during the hospital encounter of 10/23/18 (from the past 48 hour(s))  Comprehensive metabolic panel     Status: Abnormal   Collection Time: 10/23/18  5:15 AM  Result Value Ref Range   Sodium 136 135 - 145 mmol/L   Potassium 3.3 (L) 3.5 - 5.1 mmol/L   Chloride 103 98 - 111 mmol/L   CO2 23 22 - 32 mmol/L   Glucose, Bld 105 (H) 70 - 99 mg/dL   BUN 9 6 - 20 mg/dL   Creatinine, Ser 9.60 0.44 - 1.00 mg/dL   Calcium 9.0 8.9 - 45.4 mg/dL   Total Protein 8.1 6.5 - 8.1 g/dL   Albumin 4.9 3.5 - 5.0 g/dL   AST 33 15 - 41 U/L   ALT 30 0 - 44 U/L   Alkaline Phosphatase 59 38 - 126 U/L   Total Bilirubin 1.0 0.3 - 1.2 mg/dL   GFR calc non Af Amer >60 >60 mL/min   GFR calc Af Amer >60 >60 mL/min   Anion gap 10 5 - 15    Comment: Performed at [facility], [address], [place], [place] [address]  Ethanol     Status: None   Collection Time: 10/23/18  5:15 AM  Result Value Ref Range   Alcohol, Ethyl (B) <10 <10 mg/dL    Comment: (NOTE) Lowest detectable limit for serum alcohol is 10 mg/dL. For medical purposes only. Performed at [facility], [address] [place], [place] [address]   Salicylate level     Status: None   Collection Time: 10/23/18  5:15 AM  Result Value Ref Range   Salicylate Lvl <7.0 2.8 - 30.0 mg/dL    Comment: Performed at [facility], [address], [place], [place] [address]  Acetaminophen level     Status: Abnormal   Collection Time: 10/23/18  5:15 AM  Result Value Ref Range   Acetaminophen (Tylenol), Serum <10 (L) 10 -  30 ug/mL    Comment: (NOTE) Therapeutic concentrations vary  significantly. A range of 10-30 ug/mL  may be an effective concentration for many patients. However, some  are best treated at concentrations outside of this range. Acetaminophen concentrations >150 ug/mL at 4 hours after ingestion  and >50 ug/mL at 12 hours after ingestion are often associated with  toxic reactions. Performed at [facility], [address] [place], [place] [address]   cbc     Status: Abnormal   Collection Time: 10/23/18  5:15 AM  Result Value Ref Range   WBC 13.0 (H) 4.0 - 10.5 K/uL   RBC 4.07 3.87 - 5.11 MIL/uL   Hemoglobin 12.4 12.0 - 15.0 g/dL   HCT 60.438.4 54.036.0 - 98.146.0 %   MCV 94.3 80.0 - 100.0 fL   MCH 30.5 26.0 - 34.0 pg   MCHC 32.3 30.0 - 36.0 g/dL   RDW 19.114.5 47.811.5 - 29.515.5 %   Platelets 288 150 - 400 K/uL   nRBC 0.0 0.0 - 0.2 %    Comment: Performed at [facility], [address] [place], [place] [address]  Rapid urine drug screen (hospital performed)     Status: Abnormal   Collection Time: 10/23/18  5:15 AM  Result Value Ref Range   Opiates NONE DETECTED NONE DETECTED   Cocaine NONE DETECTED NONE DETECTED   Benzodiazepines NONE DETECTED NONE DETECTED   Amphetamines POSITIVE (A) NONE DETECTED   Tetrahydrocannabinol POSITIVE (A) NONE DETECTED   Barbiturates NONE DETECTED NONE DETECTED    Comment: (NOTE) DRUG SCREEN FOR MEDICAL PURPOSES ONLY.  IF CONFIRMATION IS NEEDED FOR ANY PURPOSE, NOTIFY LAB WITHIN 5 DAYS. LOWEST DETECTABLE LIMITS FOR URINE DRUG SCREEN Drug Class                     Cutoff (ng/mL) Amphetamine and metabolites    1000 Barbiturate and metabolites    200 Benzodiazepine                 200 Tricyclics and metabolites     300 Opiates and metabolites        300 Cocaine and metabolites        300 THC                            50 Performed at [facility], [address] [place], [place] [address]   Pregnancy, urine     Status: None   Collection Time: 10/23/18  5:15 AM   Result Value Ref Range   Preg Test, Ur NEGATIVE NEGATIVE    Comment:        THE SENSITIVITY OF THIS METHODOLOGY IS >20 mIU/mL. Performed at [facility], [address] [place], [place] [address]   SARS Coronavirus 2 (CEPHEID - Performed in [facility] hospital lab), Hosp Order     Status: None   Collection Time: 10/23/18 10:11 AM  Result Value Ref Range   SARS Coronavirus 2 NEGATIVE NEGATIVE    Comment: (NOTE) If result is NEGATIVE SARS-CoV-2 target nucleic acids are NOT DETECTED. The SARS-CoV-2 RNA is generally detectable in upper and lower  respiratory specimens during the acute phase of infection. The lowest  concentration of SARS-CoV-2 viral copies this assay can detect is 250  copies / mL. A negative result does not preclude SARS-CoV-2 infection  and should not be used as the sole basis for treatment or  other  patient management decisions.  A negative result may occur with  improper specimen collection / handling, submission of specimen other  than nasopharyngeal swab, presence of viral mutation(s) within the  areas targeted by this assay, and inadequate number of viral copies  (<250 copies / mL). A negative result must be combined with clinical  observations, patient history, and epidemiological information. If result is POSITIVE SARS-CoV-2 target nucleic acids are DETECTED. The SARS-CoV-2 RNA is generally detectable in upper and lower  respiratory specimens dur ing the acute phase of infection.  Positive  results are indicative of active infection with SARS-CoV-2.  Clinical  correlation with patient history and other diagnostic information is  necessary to determine patient infection status.  Positive results do  not rule out bacterial infection or co-infection with other viruses. If result is PRESUMPTIVE POSTIVE SARS-CoV-2 nucleic acids MAY BE PRESENT.   A presumptive positive result was obtained on the submitted specimen  and confirmed on repeat  testing.  While 2019 novel coronavirus  (SARS-CoV-2) nucleic acids may be present in the submitted sample  additional confirmatory testing may be necessary for epidemiological  and / or clinical management purposes  to differentiate between  SARS-CoV-2 and other Sarbecovirus currently known to infect humans.  If clinically indicated additional testing with an alternate test  methodology [phone]) is advised. The SARS-CoV-2 RNA is generally  detectable in upper and lower respiratory sp ecimens during the acute  phase of infection. The expected result is Negative. Fact Sheet for Patients:  BoilerBrush.com.cy Fact Sheet for Healthcare Providers: https://pope.com/ This test is not yet approved or cleared by the [place] FDA and has been authorized for detection and/or diagnosis of SARS-CoV-2 by FDA under an Emergency Use Authorization (EUA).  This EUA will remain in effect (meaning this test can be used) for the duration of the COVID-19 declaration under Section 564(b)(1) of the Act, 21 U.S.C. section 360bbb-3(b)(1), unless the authorization is terminated or revoked sooner. Performed at [facility], [address], [place], [place] [address]     Blood Alcohol level:  Lab Results  Component Value Date   ETH <10 10/23/2018    Metabolic Disorder Labs: Lab Results  Component Value Date   HGBA1C 5.6 07/10/2018   No results found for: PROLACTIN Lab Results  Component Value Date   CHOL 121 07/10/2018   TRIG 39 07/10/2018   HDL 36 (L) 07/10/2018   CHOLHDL 3.4 07/10/2018   LDLCALC 77 07/10/2018    Physical Findings: AIMS:  , ,  ,  ,    CIWA:    COWS:     Musculoskeletal: Strength & Muscle Tone: within normal limits Gait & Station: normal Patient leans: N/A  Psychiatric Specialty Exam: Physical Exam  ROS  Blood pressure 115/62, pulse (!) 104, temperature (!) 97.4 F (36.3 C), temperature source  Oral.There is no height or weight on file to calculate BMI.  General Appearance: Guarded  Eye Contact:  Poor  Speech:  Clear and Coherent  Volume:  Increased  Mood: Variable within the morning  Affect:  Labile  Thought Process:  Linear and Descriptions of Associations: Loose  Orientation:  Full (Time, Place, and Person)  Thought Content:  Illogical, Delusions and Tangential  Suicidal Thoughts:  No  Homicidal Thoughts:  No  Memory:  Immediate;   Fair  Judgement:  Impaired  Insight:  Fair  Psychomotor Activity:  Normal  Concentration:  Concentration: Good  Recall:  Fair  Fund of Knowledge:  Good  Language:  Good  Akathisia:  Negative  Handed:  Right  AIMS (if indicated):     Assets:  Leisure Time Physical Health Resilience  ADL's:  Intact  Cognition:  WNL  Sleep:  Number of Hours: 8.75     Treatment Plan Summary: Daily contact with patient to assess and evaluate symptoms and progress in treatment, Medication management and Plan Continue to encourage compliance continue med and illness education continue reality-based therapies no change in precautions at this point  [facility], MD 10/24/2018, 10:33 AM
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
## 2018-10-24 NOTE — BHH Group Notes (Signed)
BHH LCSW Group Therapy Note  Date/Time: 10/24/2018; 11am  Type of Therapy/Topic:  Group Therapy:  Feelings about Diagnosis  Participation Level:  Active   Mood: Pleasant   Description of Group:    This group will allow patients to explore their thoughts and feelings about diagnoses they have received. Patients will be guided to explore their level of understanding and acceptance of these diagnoses. Facilitator will encourage patients to process their thoughts and feelings about the reactions of others to their diagnosis, and will guide patients in identifying ways to discuss their diagnosis with significant others in their lives. This group will be process-oriented, with patients participating in exploration of their own experiences as well as giving and receiving support and challenge from other group members.   Therapeutic Goals: 1. Patient will demonstrate understanding of diagnosis as evidence by identifying two or more symptoms of the disorder:  2. Patient will be able to express two feelings regarding the diagnosis 3. Patient will demonstrate ability to communicate their needs through discussion and/or role plays  Summary of Patient Progress:   Pt was active and engaged throughout the group. Pt was able to explore their thoughts and feelings around their diagnosis and processed her feelings with her relationship with her family in regards to having a diagnosis. Pt was able to identify some coping skills that help her cope with having a diagnosis.      Therapeutic Modalities:   Cognitive Behavioral Therapy Brief Therapy Feelings Identification   Stephannie Peters, LCSW

## 2018-10-24 NOTE — Progress Notes (Addendum)
Nursing Progress Note: 7p-7a D: Pt currently presents with a tangential/agitated/disorganized affect and behavior. Pt states "I need you all to  Wipe down everything every 15 minutes. I look like a man, but I have a kid I need to get back to." Interacting minimally with the milieu. Pt reports good sleep during the previous night with current medication regimen.  A: Pt refused medications per providers orders. Pt's labs and vitals were monitored throughout the night. Pt supported emotionally and encouraged to express concerns and questions. Pt educated on medications.  R: Pt's safety ensured with 15 minute and environmental checks. Pt currently denies SI, HI, and AVH. Pt verbally contracts to seek staff if SI,HI, or AVH occurs and to consult with staff before acting on any harmful thoughts. Will continue to monitor.

## 2018-10-24 NOTE — Progress Notes (Addendum)
Recreation Therapy Notes  INPATIENT RECREATION THERAPY ASSESSMENT  Patient Details Name: Terrylee Arender MRN: [id number] DOB: 20-Sep-1980 Today's Date: 10/24/2018       Information Obtained From: Patient  Able to Participate in Assessment/Interview: Yes  Patient Presentation: Alert  Reason for Admission (Per Patient): Other (Comments)(Pt stated her mother and the police.)  Patient Stressors: Other Barnie Alderman)  Coping Skills:   Isolation, TV, Music, Exercise, Meditate, Deep Breathing, Talk, Prayer, Read  Leisure Interests (2+):  Games - Video games, Exercise - Walking, Social - Family, Music - Listen, Community - Other (Comment), Individual - Other (Comment)(Self care; Shopping)  Frequency of Recreation/Participation: Other (Comment)(Daily)  Awareness of Community Resources:  Yes  Community Resources:  Library, [employer], [employer], Tree surgeon  Current Use: Yes  If no, Barriers?:    Expressed Interest in [employer] Information: No  [employer] of Residence:  [place]  Patient Main Form of Transportation: Walk(Pt also uses public transportation)  Patient Strengths:  Being a IT sales professional; Listening  Patient Identified Areas of Improvement:  Being rude on purpose; Try to be understanding of mother and where she's coming from  Patient Goal for Hospitalization:  "get out"  Current SI (including self-harm):  No  Current HI:  No  Current AVH: No  Staff Intervention Plan: Group Attendance, Collaborate with Interdisciplinary Treatment Team  Consent to Intern Participation: N/A   Caroll Rancher, LRT/CTRS  Caroll Rancher A 10/24/2018, 2:08 PM

## 2018-10-24 NOTE — Progress Notes (Signed)
Recreation Therapy Notes  Date: 5.12.20 Time: 0950 Location: [address] Day[address]  Group Topic: Wellness  Goal Area(s) Addresses:  Patient will define components of whole wellness. Patient will verbalize benefit of whole wellness.  Intervention:  Music   Activity:  Exercise.  LRT lead the group in a series of stretches.  Patients then took turns leading the group in exercises of their choice.  Patients could take breaks and get water as needed.  Education: Wellness, Building control surveyor.   Education Outcome: Acknowledges education/In group clarification offered/Needs additional education.   Clinical Observations/Feedback:  Pt did not attend group.    Caroll Rancher, LRT/CTRS         Lillia Abed, Laurelyn Terrero A 10/24/2018 11:15 AM

## 2018-10-25 MED ORDER — PERPHENAZINE 4 MG PO TABS
4.0000 mg | ORAL_TABLET | Freq: Three times a day (TID) | ORAL | Status: DC
  Administered 2018-10-25 – 2018-10-27 (×6): 4 mg via ORAL
  Filled 2018-10-25 (×10): qty 1

## 2018-10-25 NOTE — Progress Notes (Signed)

## 2018-10-25 NOTE — [facility] Suicide Risk Assessment (Signed)
[facility] INPATIENT:  Family/Significant Other Suicide Prevention Education  Suicide Prevention Education:  Education Completed;[name] Andrey Campanile, mother, [phone],  has been identified by the patient as the family member/significant other with whom the patient will be residing, and identified as the person(s) who will aid the patient in the event of a mental health crisis (suicidal ideations/suicide attempt).  With written consent from the patient, the family member/significant other has been provided the following suicide prevention education, prior to the and/or following the discharge of the patient.  The suicide prevention education provided includes the following:  Suicide risk factors  Suicide prevention and interventions  National Suicide Hotline telephone number  [facility] assessment telephone number  [facility] Emergency Assistance 911  [facility] and/or Residential Mobile Crisis Unit telephone number  Request made of family/significant other to:  Remove weapons (e.g., guns, rifles, knives), all items previously/currently identified as safety concern.    Remove drugs/medications (over-the-counter, prescriptions, illicit drugs), all items previously/currently identified as a safety concern.  The family member/significant other verbalizes understanding of the suicide prevention education information provided.  The family member/significant other agrees to remove the items of safety concern listed above.  Mother reports that pt can return once she is discharged.  Pt stressed with all the virus things going on.  Pt does have to report as a sex offender every 3 months--mom does not have the contact information to call this person.  Pt worried about missing a report in and what consequences she could face.  Lorri Frederick, LCSW 10/25/2018, 2:52 PM

## 2018-10-25 NOTE — Progress Notes (Signed)
Recreation Therapy Notes  Date: 5.13.20 Time: 1000 Location: 500 Hall Dayroom  Group Topic: Goal Setting  Goal Area(s) Addresses:  Patient will be able to identify at least 3 goals.  Patient will be able to identify obstacles to reaching goals.  Patient will be able to identify what is needed to achieve goals.   Behavioral Response:  Engaged  Intervention:  Worksheet  Activity:  Garment/textile technologist.  Patients were to identify goals they want to accomplish in a week, month, year and 5 years.  Patients were to then identify obstacles to goals, what they need to achieve goals and what they can start doing now to reach goals.  Education:  Discharge Planning, Pharmacologist, Leisure Education   Education Outcome: Acknowledges Education/In Group Clarification Provided/Needs Additional Education  Clinical Observations:  Pt expressed goals could be short term or long term and they are used to keep people focused.  Pt was pre-occupied with her stimulus check and her mother taking it.  Pt expressed in a week she wants to got out of here, plan daughters birthday party and check in at sheriff's office.  Pt expressed she needs God and the [facility] to achieve goals and doesn't need anything to start towards goals.     Caroll Rancher, LRT/CTRS     Lillia Abed, Donivin Wirt A 10/25/2018 11:12 AM

## 2018-10-25 NOTE — Progress Notes (Signed)
Nursing Progress Note: 7p-7a D: Pt currently presents with a tangential/agitated/disorganized affect and behavior. Pt states "I've been worried of having my thyroid med when I go home. I need it. Can you guys send it home?" Interacting loudly with the milieu. Pt reports good sleep during the previous night with current medication regimen.  A: Pt given medications per providers orders. Pt's labs and vitals were monitored throughout the night. Pt supported emotionally and encouraged to express concerns and questions. Pt educated on medications.  R: Pt's safety ensured with 15 minute and environmental checks. Pt currently denies SI, HI, and AVH. Pt verbally contracts to seek staff if SI,HI, or AVH occurs and to consult with staff before acting on any harmful thoughts. Will continue to monitor.

## 2018-10-25 NOTE — [facility] Group Notes (Signed)
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 I want to complain"  O: OT group with focus on leisure and coping skills this date by using the game of Uno. Pt observed for attention, rule following, temperament, and socialization skills while playing the game. Pt to name a variety of coping skills per special cards to include (stress management, a current struggle you're facing, healthy anger management, a healthy communication skill, and preferred leisure activity). Discussion encouraged for healthy coping skills.  A: Pt presents with irritable affect, not willing to participate in activity. Pt overall very irritable and vocalizing this frequently while sitting in the back of group. Pt shares she wants to file a complaint and asked OT for pencil and paper. Pt very fixated on the staff and their personalities. She did not at all participate and continued to make irritable comments throughout group.  P: OT gorup will be x1 per week while pt inpatient.  Dalphine Handing, MSOT, OTR/L Behavioral Health OT/ Acute Relief OT PHP Office: [phone]  Dalphine Handing 10/25/2018, 2:48 PM

## 2018-10-25 NOTE — Tx Team (Signed)
Interdisciplinary Treatment and Diagnostic Plan Update  10/25/2018 Time of Session: 0923 Hannah Armstrong MRN: [id number]  Principal Diagnosis: <principal problem not specified>  Secondary Diagnoses: Active Problems:   Schizoaffective disorder, bipolar type (HCC)   Schizoaffective disorder (HCC)   Current Medications:  Current Facility-Administered Medications  Medication Dose Route Frequency Provider Last Rate Last Dose  . acetaminophen (TYLENOL) tablet 650 mg  650 mg Oral Q6H PRN Hannah Johns, MD   650 mg at 10/24/18 0408  . alum & mag hydroxide-simeth (MAALOX/MYLANTA) 200-200-20 MG/5ML suspension 30 mL  30 mL Oral Q4H PRN Hannah Johns, MD      . benztropine (COGENTIN) tablet 0.5 mg  0.5 mg Oral BID Hannah Johns, MD   0.5 mg at 10/24/18 1713  . clonazePAM (KLONOPIN) tablet 1 mg  1 mg Oral TID Hannah Johns, MD   1 mg at 10/24/18 1713  . haloperidol (HALDOL) tablet 7 mg  7 mg Oral TID Hannah Johns, MD      . hydrOXYzine (ATARAX/VISTARIL) tablet 50 mg  50 mg Oral TID PRN Hannah Johns, MD      . levothyroxine (SYNTHROID) tablet 50 mcg  50 mcg Oral Q0600 Hannah Johns, MD   50 mcg at 10/25/18 [phone]  . magnesium hydroxide (MILK OF MAGNESIA) suspension 30 mL  30 mL Oral Daily PRN Hannah Johns, MD      . perphenazine (TRILAFON) tablet 4 mg  4 mg Oral TID Hannah Johns, MD      . propranolol (INDERAL) tablet 20 mg  20 mg Oral BID Hannah Johns, MD   20 mg at 10/24/18 1713  . temazepam (RESTORIL) capsule 30 mg  30 mg Oral QHS Hannah Johns, MD   30 mg at 10/23/18 2151  . traZODone (DESYREL) tablet 150 mg  150 mg Oral QHS PRN Hannah Johns, MD       PTA Medications: Medications Prior to Admission  Medication Sig Dispense Refill Last Dose  . levothyroxine (SYNTHROID, LEVOTHROID) 75 MCG tablet Take 1 tablet (75 mcg total) by mouth daily before breakfast. 30 tablet 2 10/22/2018 at Unknown time  . SUMAtriptan (IMITREX) 50 MG tablet Take 1 tab at the start of the headache.  May repeat in 2 hours  if no relief.  Max 2 tabs/24 hours 10 tablet 2 Past Week at Unknown time  . topiramate (TOPAMAX) 25 MG tablet Take 1 tablet (25 mg total) by mouth at bedtime. 30 tablet 3 10/23/2018 at Unknown time    Patient Stressors:    Patient Strengths:    Treatment Modalities: Medication Management, Group therapy, Case management,  1 to 1 session with clinician, Psychoeducation, Recreational therapy.   Physician Treatment Plan for Primary Diagnosis: <principal problem not specified> Long Term Goal(s): Improvement in symptoms so as ready for discharge Improvement in symptoms so as ready for discharge   Short Term Goals: Ability to verbalize feelings will improve Ability to disclose and discuss suicidal ideas Ability to maintain clinical measurements within normal limits will improve  Medication Management: Evaluate patient's response, side effects, and tolerance of medication regimen.  Therapeutic Interventions: 1 to 1 sessions, Unit Group sessions and Medication administration.  Evaluation of Outcomes: Progressing  Physician Treatment Plan for Secondary Diagnosis: Active Problems:   Schizoaffective disorder, bipolar type (HCC)   Schizoaffective disorder (HCC)  Long Term Goal(s): Improvement in symptoms so as ready for discharge Improvement in symptoms so as ready for discharge   Short Term Goals: Ability to verbalize feelings will improve Ability to disclose and discuss suicidal  ideas Ability to maintain clinical measurements within normal limits will improve     Medication Management: Evaluate patient's response, side effects, and tolerance of medication regimen.  Therapeutic Interventions: 1 to 1 sessions, Unit Group sessions and Medication administration.  Evaluation of Outcomes: Progressing   RN Treatment Plan for Primary Diagnosis: <principal problem not specified> Long Term Goal(s): Knowledge of disease and therapeutic regimen to maintain health will improve  Short Term  Goals: Ability to identify and develop effective coping behaviors will improve and Compliance with prescribed medications will improve  Medication Management: RN will administer medications as ordered by provider, will assess and evaluate patient's response and provide education to patient for prescribed medication. RN will report any adverse and/or side effects to prescribing provider.  Therapeutic Interventions: 1 on 1 counseling sessions, Psychoeducation, Medication administration, Evaluate responses to treatment, Monitor vital signs and CBGs as ordered, Perform/monitor CIWA, COWS, AIMS and Fall Risk screenings as ordered, Perform wound care treatments as ordered.  Evaluation of Outcomes: Progressing   LCSW Treatment Plan for Primary Diagnosis: <principal problem not specified> Long Term Goal(s): Safe transition to appropriate next level of care at discharge, Engage patient in therapeutic group addressing interpersonal concerns.  Short Term Goals: Engage patient in aftercare planning with referrals and resources, Increase social support and Increase skills for wellness and recovery  Therapeutic Interventions: Assess for all discharge needs, 1 to 1 time with Social worker, Explore available resources and support systems, Assess for adequacy in community support network, Educate family and significant other(s) on suicide prevention, Complete Psychosocial Assessment, Interpersonal group therapy.  Evaluation of Outcomes: Progressing   Progress in Treatment: Attending groups: Yes. Participating in groups: Yes. Taking medication as prescribed: Yes. Toleration medication: Yes. Family/Significant other contact made: No, will contact:  mother Patient understands diagnosis: No. Discussing patient identified problems/goals with staff: Yes. Medical problems stabilized or resolved: Yes. Denies suicidal/homicidal ideation: Yes. Issues/concerns per patient self-inventory: No. Other: none  New  problem(s) identified: No, Describe:  none  New Short Term/Long Term Goal(s):  Patient Goals:  "discharge"  Discharge Plan or Barriers:   Reason for Continuation of Hospitalization: Hallucinations Medication stabilization  Estimated Length of Stay: 2-4 days.  Attendees: Patient: Hannah Armstrong 10/25/2018   Physician: Dr. Jeannine KittenFarah, MD 10/25/2018   Nursing: Elby Beckoni Waller, RN 10/25/2018   RN Care Manager: 10/25/2018   Social Worker: Daleen SquibbGreg Johan Creveling, LCSW 10/25/2018   Recreational Therapist:  10/25/2018   Other:  10/25/2018   Other:  10/25/2018   Other: 10/25/2018        Scribe for Treatment Team: Lorri FrederickWierda, Indra Wolters Jon, LCSW 10/25/2018 12:43 PM

## 2018-10-25 NOTE — Progress Notes (Signed)
[facility]  10/25/2018 10:33 AM Hannah Armstrong  MRN:  [id number] Subjective:   Patient continues to resist medication is generally argumentative stating she does not need anything but a thyroid pill believes we got all the other medications and correct.  She is much better than when she came in just the structure of hospital care and some intermittent dosing seems have been helpful but still not baseline or safe to be released.  Not quite meeting the threshold for forced medications she does not this and she will deteriorate without meds but she is not acutely dangerous so we will revisit this if she continues to be noncompliant Principal Problem: psychotic disorder untreated and noncompliant Diagnosis: Active Problems:   Schizoaffective disorder, bipolar type (HCC)   Schizoaffective disorder (HCC)  Total Time spent with patient: 20 minutes    Past Medical History: History reviewed. No pertinent past medical history.  Past Surgical History:  Procedure Laterality Date  . ANTERIOR CRUCIATE LIGAMENT (ACL) REVISION Right 2016 Nov   Family History:  Family History  Problem Relation Age of Onset  . Hypertension Mother   . Breast cancer Sister   . Diabetes Maternal Aunt   . Sickle cell anemia Paternal Aunt   . Breast cancer Paternal Aunt    Family Psychiatric  History: neg Social History:  Social History   Substance and Sexual Activity  Alcohol Use Not Currently     Social History   Substance and Sexual Activity  Drug Use Yes   Comment: CBD - hump    Social History   Socioeconomic History  . Marital status: Unknown    Spouse name: Not on file  . Number of children: Not on file  . Years of education: Not on file  . Highest education level: Not on file  Occupational History  . Not on file  Social Needs  . Financial resource strain: Not on file  . Food insecurity:    Worry: Not on file    Inability: Not on file  . Transportation needs:    Medical:  Not on file    Non-medical: Not on file  Tobacco Use  . Smoking status: Current Some Day Smoker    Packs/day: 0.25    Years: 16.00    Pack years: 4.00    Types: Cigarettes  . Smokeless tobacco: Never Used  Substance and Sexual Activity  . Alcohol use: Not Currently  . Drug use: Yes    Comment: CBD - hump  . Sexual activity: Not on file  Lifestyle  . Physical activity:    Days per week: Not on file    Minutes per session: Not on file  . Stress: Not on file  Relationships  . Social connections:    Talks on phone: Not on file    Gets together: Not on file    Attends religious service: Not on file    Active member of club or organization: Not on file    Attends meetings of clubs or organizations: Not on file    Relationship status: Not on file  Other Topics Concern  . Not on file  Social History Narrative  . Not on file   Additional Social History:                         Sleep: Fair  Appetite:  Fair  Current Medications: Current Facility-Administered Medications  Medication Dose Route Frequency Provider Last Rate Last Dose  . acetaminophen (  TYLENOL) tablet 650 mg  650 mg Oral Q6H PRN Malvin Johns, MD   650 mg at 10/24/18 0408  . alum & mag hydroxide-simeth (MAALOX/MYLANTA) 200-200-20 MG/5ML suspension 30 mL  30 mL Oral Q4H PRN Malvin Johns, MD      . benztropine (COGENTIN) tablet 0.5 mg  0.5 mg Oral BID Malvin Johns, MD   0.5 mg at 10/24/18 1713  . clonazePAM (KLONOPIN) tablet 1 mg  1 mg Oral TID Malvin Johns, MD   1 mg at 10/24/18 1713  . haloperidol (HALDOL) tablet 7 mg  7 mg Oral TID Malvin Johns, MD      . hydrOXYzine (ATARAX/VISTARIL) tablet 50 mg  50 mg Oral TID PRN Malvin Johns, MD      . levothyroxine (SYNTHROID) tablet 50 mcg  50 mcg Oral Q0600 Malvin Johns, MD   50 mcg at 10/25/18 [phone]  . magnesium hydroxide (MILK OF MAGNESIA) suspension 30 mL  30 mL Oral Daily PRN Malvin Johns, MD      . perphenazine (TRILAFON) tablet 4 mg  4 mg Oral TID Malvin Johns, MD      . propranolol (INDERAL) tablet 20 mg  20 mg Oral BID Malvin Johns, MD   20 mg at 10/24/18 1713  . temazepam (RESTORIL) capsule 30 mg  30 mg Oral QHS Malvin Johns, MD   30 mg at 10/23/18 2151  . traZODone (DESYREL) tablet 150 mg  150 mg Oral QHS PRN Malvin Johns, MD        Lab Results: No results found for this or any previous visit (from the past 48 hour(s)).  Blood Alcohol level:  Lab Results  Component Value Date   ETH <10 10/23/2018    Metabolic Disorder Labs: Lab Results  Component Value Date   HGBA1C 5.6 07/10/2018   No results found for: PROLACTIN Lab Results  Component Value Date   CHOL 121 07/10/2018   TRIG 39 07/10/2018   HDL 36 (L) 07/10/2018   CHOLHDL 3.4 07/10/2018   LDLCALC 77 07/10/2018    Physical Findings: AIMS:  , ,  ,  ,    CIWA:    COWS:     Musculoskeletal: Strength & Muscle Tone: within normal limits Gait & Station: normal Patient leans: N/A  Psychiatric Specialty Exam: Physical Exam  ROS  Blood pressure 125/86, pulse 99, temperature (!) 97.4 F (36.3 C), temperature source Oral.There is no height or weight on file to calculate BMI.  General Appearance: Casual and Disheveled  Eye Contact:  Fair  Speech:  Clear and Coherent  Volume:  Increased  Mood:  Angry and Irritable  Affect:  Congruent  Thought Process:  Linear and Descriptions of Associations: Loose  Orientation:  Full (Time, Place, and Person)  Thought Content:  Illogical and Delusions  Suicidal Thoughts:  No  Homicidal Thoughts:  No  Memory:  Recent;   Fair  Judgement:  Impaired  Insight:  Lacking  Psychomotor Activity:  Normal  Concentration:  Concentration: Fair  Recall:  [employer] of Knowledge:  Fair  Language:  Fair  Akathisia:  Negative  Handed:  Right  AIMS (if indicated):     Assets:  Resilience Social Support  ADL's:  Intact  Cognition:  WNL  Sleep:  Number of Hours: 5.25     Treatment Plan Summary: Daily contact with patient to assess and  evaluate symptoms and progress in treatment, Medication management and Plan Continue current cognitive and reality-based therapy continue current meds and encourage compliance  Malvin Johns, MD 10/25/2018, 10:33 AM

## 2018-10-26 MED ORDER — RISPERIDONE 3 MG PO TABS
3.0000 mg | ORAL_TABLET | Freq: Three times a day (TID) | ORAL | Status: DC
  Administered 2018-10-26 – 2018-10-27 (×4): 3 mg via ORAL
  Filled 2018-10-26 (×8): qty 1

## 2018-10-26 MED ORDER — ADULT MULTIVITAMIN W/MINERALS CH
1.0000 | ORAL_TABLET | Freq: Every day | ORAL | Status: DC
  Administered 2018-10-26 – 2018-10-27 (×2): 1 via ORAL
  Filled 2018-10-26 (×4): qty 1

## 2018-10-26 NOTE — Plan of Care (Signed)
Progress note  Pt presented to the medication window agitated and anxious. Pt wanted her medication changed which was considered and initiated. Pt compliant with medication administration. Pt has been verbally agitated on the phone and escalates when patterns of behavior with staff aren't followed accordingly. Pt is fixated on procedure and tries to use this manipulatively. Pt denies any physical pain. Pt safe on the unit. Will continue to monitor. Pt denies si/hi/ah/vh and verbally agrees to approach staff if these become apparent or before harming himself/others while at [facility].  Pt progressing in the following metrics  Problem: Education: Goal: Knowledge of [place] [facility] Education information/materials will improve Outcome: Progressing Goal: Emotional status will improve Outcome: Progressing Goal: Mental status will improve Outcome: Progressing Goal: Verbalization of understanding the information provided will improve Outcome: Progressing

## 2018-10-26 NOTE — [facility] Group Notes (Signed)
[facility] LCSW Group Therapy Note  Date/Time: 10/26/18, 1315  Type of Therapy/Topic:  Group Therapy:  Balance in Life  Participation Level:  active  Description of Group:    This group will address the concept of balance and how it feels and looks when one is unbalanced. Patients will be encouraged to process areas in their lives that are out of balance, and identify reasons for remaining unbalanced. Facilitators will guide patients utilizing problem- solving interventions to address and correct the stressor making their life unbalanced. Understanding and applying boundaries will be explored and addressed for obtaining  and maintaining a balanced life. Patients will be encouraged to explore ways to assertively make their unbalanced needs known to significant others in their lives, using other group members and facilitator for support and feedback.  Therapeutic Goals: 1. Patient will identify two or more emotions or situations they have that consume much of in their lives. 2. Patient will identify signs/triggers that life has become out of balance:  3. Patient will identify two ways to set boundaries in order to achieve balance in their lives:  4. Patient will demonstrate ability to communicate their needs through discussion and/or role plays  Summary of Patient Progress: Pt came to group for first time with CSW and was appropriate.  Pt participated, made good comments, and was attentive.  Pt stated that physical and financial are areas that are currently out of balance in her life and was active participant in the group discussion.           Therapeutic Modalities:   Cognitive Behavioral Therapy Solution-Focused Therapy Assertiveness Training  Daleen Squibb, [place]

## 2018-10-26 NOTE — Progress Notes (Signed)
The focus of this group is to help patients establish daily goals to achieve during treatment and discuss how the patient can incorporate goal setting into their daily lives to aide in recovery.The patient also said that she is comlpying with MD orders.

## 2018-10-26 NOTE — Progress Notes (Signed)
[facility] Note  10/26/2018 7:44 AM Hannah Armstrong  MRN:  [id number] Subjective:    Remains irritable and argumentative grabs me in the hallway to demand "do not brainwashed me or manipulate me" stating she will not take Haldol we have a long discussion about what she will and will not take eventually she says "give me anything" and she agrees to take Risperdal. When she has idle time she is on the phone and she is speaking loudly and in an aggressive fashion towards family members and it is clear she is still self agitating/paranoid and not well, I explained her that she simply needs to take medicines in order to get home Principal Problem: Untreated schizoaffective/manic symptoms Diagnosis: Active Problems:   Schizoaffective disorder, bipolar type (HCC)   Schizoaffective disorder (HCC)  Total Time spent with patient: 20 minutes  Past Psychiatric History: extensive  Past Medical History: History reviewed. No pertinent past medical history.  Past Surgical History:  Procedure Laterality Date  . ANTERIOR CRUCIATE LIGAMENT (ACL) REVISION Right 2016 Nov   Family History:  Family History  Problem Relation Age of Onset  . Hypertension Mother   . Breast cancer Sister   . Diabetes Maternal Aunt   . Sickle cell anemia Paternal Aunt   . Breast cancer Paternal Aunt    Family Psychiatric  History: neg Social History:  Social History   Substance and Sexual Activity  Alcohol Use Not Currently     Social History   Substance and Sexual Activity  Drug Use Yes   Comment: CBD - hump    Social History   Socioeconomic History  . Marital status: Unknown    Spouse name: Not on file  . Number of children: Not on file  . Years of education: Not on file  . Highest education level: Not on file  Occupational History  . Not on file  Social Needs  . Financial resource strain: Not on file  . Food insecurity:    Worry: Not on file    Inability: Not on file  . Transportation  needs:    Medical: Not on file    Non-medical: Not on file  Tobacco Use  . Smoking status: Current Some Day Smoker    Packs/day: 0.25    Years: 16.00    Pack years: 4.00    Types: Cigarettes  . Smokeless tobacco: Never Used  Substance and Sexual Activity  . Alcohol use: Not Currently  . Drug use: Yes    Comment: CBD - hump  . Sexual activity: Not on file  Lifestyle  . Physical activity:    Days per week: Not on file    Minutes per session: Not on file  . Stress: Not on file  Relationships  . Social connections:    Talks on phone: Not on file    Gets together: Not on file    Attends religious service: Not on file    Active member of club or organization: Not on file    Attends meetings of clubs or organizations: Not on file    Relationship status: Not on file  Other Topics Concern  . Not on file  Social History Narrative  . Not on file   Additional Social History:                         Sleep: Fair  Appetite:  Fair  Current Medications: Current Facility-Administered Medications  Medication Dose Route Frequency Provider Last Rate  Last Dose  . acetaminophen (TYLENOL) tablet 650 mg  650 mg Oral Q6H PRN Malvin Johns, MD   650 mg at 10/24/18 0408  . alum & mag hydroxide-simeth (MAALOX/MYLANTA) 200-200-20 MG/5ML suspension 30 mL  30 mL Oral Q4H PRN Malvin Johns, MD      . benztropine (COGENTIN) tablet 0.5 mg  0.5 mg Oral BID Malvin Johns, MD   0.5 mg at 10/25/18 1754  . hydrOXYzine (ATARAX/VISTARIL) tablet 50 mg  50 mg Oral TID PRN Malvin Johns, MD      . levothyroxine (SYNTHROID) tablet 50 mcg  50 mcg Oral Q0600 Malvin Johns, MD   50 mcg at 10/26/18 [phone]  . magnesium hydroxide (MILK OF MAGNESIA) suspension 30 mL  30 mL Oral Daily PRN Malvin Johns, MD      . perphenazine (TRILAFON) tablet 4 mg  4 mg Oral TID Malvin Johns, MD   4 mg at 10/25/18 1754  . propranolol (INDERAL) tablet 20 mg  20 mg Oral BID Malvin Johns, MD   Stopped at 10/26/18 [phone]  . risperiDONE  (RISPERDAL) tablet 3 mg  3 mg Oral TID Malvin Johns, MD      . temazepam (RESTORIL) capsule 30 mg  30 mg Oral QHS Malvin Johns, MD   30 mg at 10/25/18 2107  . traZODone (DESYREL) tablet 150 mg  150 mg Oral QHS PRN Malvin Johns, MD        Lab Results: No results found for this or any previous visit (from the past 48 hour(s)).  Blood Alcohol level:  Lab Results  Component Value Date   ETH <10 10/23/2018    Metabolic Disorder Labs: Lab Results  Component Value Date   HGBA1C 5.6 07/10/2018   No results found for: PROLACTIN Lab Results  Component Value Date   CHOL 121 07/10/2018   TRIG 39 07/10/2018   HDL 36 (L) 07/10/2018   CHOLHDL 3.4 07/10/2018   LDLCALC 77 07/10/2018    Physical Findings: AIMS:  , ,  ,  ,    CIWA:    COWS:     Musculoskeletal: Strength & Muscle Tone: within normal limits Gait & Station: normal Patient leans: N/A  Psychiatric Specialty Exam: Physical Exam  ROS  Blood pressure 102/77, pulse 64, temperature (!) 97.4 F (36.3 C), temperature source Oral.There is no height or weight on file to calculate BMI.  General Appearance: Casual  Eye Contact:  Fair  Speech:  Pressured  Volume:  Increased  Mood:  Angry and Irritable  Affect:  Congruent and Labile  Thought Process:  Irrelevant and Descriptions of Associations: Circumstantial  Orientation:  Full (Time, Place, and Person)  Thought Content:  Illogical, Delusions and Paranoid Ideation  Suicidal Thoughts:  No  Homicidal Thoughts:  No  Memory:  Immediate;   Fair  Judgement:  Impaired  Insight:  Lacking  Psychomotor Activity:  Normal  Concentration:  Concentration: Fair  Recall:  Fair  Fund of Knowledge:  Good  Language:  Good  Akathisia:  Negative  Handed:  Right  AIMS (if indicated):     Assets:  Physical Health Resilience  ADL's:  Intact  Cognition:  WNL  Sleep:  Number of Hours: 6.5     Treatment Plan Summary: Daily contact with patient to assess and evaluate symptoms and progress  in treatment, Medication management and Plan After much discussion we will switch Haldol to Risperdal hopefully she will comply, was seeking Vistaril instead I explained her that that would simply not work for her  level of pathology.  Still somewhat paranoid in easily agitated and self agitating, the more she talks the more angry she gets  Malvin Johns, MD 10/26/2018, 7:44 AM

## 2018-10-26 NOTE — Progress Notes (Signed)
Recreation Therapy Notes  Date: 5.14.20 Time: 1000 Location: 500 Hall Dayroom  Group Topic: Self-Esteem  Goal Area(s) Addresses:  Patient will successfully identify positive attributes about themselves.  Patient will successfully identify benefit of improved self-esteem.   Behavioral Response: Engaged  Intervention: Colored pencils, blank license plate  Activity: Personalized Plates.  Patients were given supplies to create Armstrong customized license plate that highlights some of the positive things about them.  Patients could highlight activities, people, dates, etc.  Education:  Self-Esteem, Building control surveyor.   Education Outcome: Acknowledges education/In group clarification offered/Needs additional education  Clinical Observations/Feedback: Pt stated her self esteem was influenced by the way people act and treat her.  Pt also stated she has good confidence.  Pt put on her plate she is the best mom, good daughter, loves her daughter and nephew, understanding and empathetic.     Hannah Armstrong, LRT/CTRS    Hannah Armstrong, Hannah Armstrong 10/26/2018 11:10 AM

## 2018-10-27 MED ORDER — TEMAZEPAM 30 MG PO CAPS: 30.0000 mg | ORAL_CAPSULE | Freq: Every day | ORAL | 1 refills | Status: DC

## 2018-10-27 MED ORDER — RISPERIDONE 3 MG PO TABS: 3.0000 mg | ORAL_TABLET | Freq: Three times a day (TID) | ORAL | 2 refills | Status: DC

## 2018-10-27 MED ORDER — PROPRANOLOL HCL 10 MG PO TABS: 10.0000 mg | ORAL_TABLET | Freq: Two times a day (BID) | ORAL | 3 refills | Status: DC

## 2018-10-27 MED ORDER — ARIPIPRAZOLE ER 400 MG IM SRER
400.0000 mg | INTRAMUSCULAR | Status: DC
  Administered 2018-10-27: 400 mg via INTRAMUSCULAR

## 2018-10-27 MED ORDER — BENZTROPINE MESYLATE 0.5 MG PO TABS: 0.5000 mg | ORAL_TABLET | Freq: Two times a day (BID) | ORAL | 3 refills | Status: DC

## 2018-10-27 MED ORDER — ARIPIPRAZOLE ER 400 MG IM SRER: 400.0000 mg | INTRAMUSCULAR | 11 refills | Status: DC

## 2018-10-27 NOTE — Progress Notes (Signed)
  [facility] Adult Case Management Discharge Plan :  Will you be returning to the same living situation after discharge:  Yes,  with mother At discharge, do you have transportation home?: No.Pt will ride bus.  No fare currently due to virus. Do you have the ability to pay for your medications: Yes,  medicaid  Release of information consent forms completed and in the chart;  Patient's signature needed at discharge.  Patient to Follow up at: Follow-up Information    [facility] Follow up on 10/31/2018.   Why:  Your telephone intake appt is Tuesday, 10/31/18, at 1030am.  Please be ready to receive the phone call at that time. Contact information: [address] [place] [address] [phone]           Next level of care provider has access to [facility] Link:no  Safety Planning and Suicide Prevention discussed: Yes,  with mother     Has patient been referred to the Quitline?: Patient refused referral  Patient has been referred for addiction treatment: Yes, Rachell Cipro, LCSW 10/27/2018, 10:15 AM

## 2018-10-27 NOTE — BHH Suicide Risk Assessment (Signed)
[facility] Discharge Suicide Risk Assessment   Principal Problem: Exacerbation of underlying schizoaffective type condition Discharge Diagnoses: Active Problems:   Schizoaffective disorder, bipolar type (HCC)   Schizoaffective disorder (HCC)   Total Time spent with patient: 45 minutes  Musculoskeletal: Strength & Muscle Tone: within normal limits Gait & Station: normal Patient leans: N/A  Psychiatric Specialty Exam: ROS  Blood pressure (!) 107/56, pulse 76, temperature (!) 97.3 F (36.3 C), temperature source Oral.There is no height or weight on file to calculate BMI.  General Appearance: Casual  Eye Contact::  Good  Speech:  Clear and Coherent409  Volume:  Normal  Mood:  Euthymic  Affect:  Restricted  Thought Process:  Coherent and Descriptions of Associations: Intact  Orientation:  Full (Time, Place, and Person)  Thought Content:  Logical and Tangential  Suicidal Thoughts:  No  Homicidal Thoughts:  No  Memory:  Immediate;   Fair  Judgement:  Intact  Insight:  Fair  Psychomotor Activity:  Normal  Concentration:  Good  Recall:  Good  Fund of Knowledge:Good  Language: Good  Akathisia:  Negative  Handed:  Right  AIMS (if indicated):     Assets:  Communication Skills Desire for Improvement  Sleep:  Number of Hours: 6.25  Cognition: WNL  ADL's:  Intact   Mental Status Per Nursing Assessment::   On Admission:  NA  Demographic Factors:  Unemployed  Loss Factors: Decrease in vocational status  Historical Factors: NA  Risk Reduction Factors:   Religious beliefs about death  Continued Clinical Symptoms:  Bipolar Disorder:   Mixed State  Cognitive Features That Contribute To Risk:  None    Suicide Risk:  Minimal: No identifiable suicidal ideation.  Patients presenting with no risk factors but with morbid ruminations; may be classified as minimal risk based on the severity of the depressive symptoms    Plan Of Care/Follow-up recommendations:  Activity:   full  Chellie Vanlue, MD 10/27/2018, 9:19 AM

## 2018-10-27 NOTE — Progress Notes (Signed)
Patient shared that her positive event for the day was that no one yelled at her while she was talking on the phone. She verbalized that her family normally yells at her on the phone quite a bit. In addition, she was pleased about the fact that her mother has been going to the supermarket and has been stocking her shelves with food. Her goal for tomorrow is to be the "best person" that she can be.

## 2018-10-27 NOTE — Progress Notes (Signed)
D: Patient observed in the milieu this evening. Is polite on approach and no outbursts this evening. Patient states, "I'm ready to lay down. Do you have meds for me?" Patient's affect anxious, depressed and preoccupied with congruent mood. Denies pain, physical complaints. COVID-19 screen negative, afebrile. Respiratory assessment WDL.  A: Medicated per orders, no prns requested or needed. Medication education provided. Level III obs in place for safety. Emotional support offered. Patient encouraged to complete Suicide Safety Plan before discharge. Encouraged to attend and participate in unit programming.  Fall prevention plan in place and reviewed with patient as pt is a high fall risk.   R: Patient verbalizes some understanding of POC, falls prevention education. Patient denies SI/HI/AVH and remains safe on level III obs. Will continue to monitor throughout the night.

## 2018-10-27 NOTE — Discharge Summary (Signed)
Physician Discharge Summary Note  Patient:  Hannah Armstrong is an 38 y.o., female MRN:  [id number] DOB:  1981-03-18 Patient phone:  [phone] (home)  Patient address:   [address] [place] [address],  Total Time spent with patient: 15 minutes  Date of Admission:  10/23/2018 Date of Discharge: 10/27/18  Reason for Admission:  Psychotic symptoms  Principal Problem: <principal problem not specified> Discharge Diagnoses: Active Problems:   Schizoaffective disorder, bipolar type (HCC)   Schizoaffective disorder (HCC)   Past Psychiatric History: Previously diagnosed with bipolar disorder and ADHD. Multiple prior hospitalizations in [place].  Past Medical History: History reviewed. No pertinent past medical history.  Past Surgical History:  Procedure Laterality Date  . ANTERIOR CRUCIATE LIGAMENT (ACL) REVISION Right 2016 Nov   Family History:  Family History  Problem Relation Age of Onset  . Hypertension Mother   . Breast cancer Sister   . Diabetes Maternal Aunt   . Sickle cell anemia Paternal Aunt   . Breast cancer Paternal Aunt    Family Psychiatric  History: Denies Social History:  Social History   Substance and Sexual Activity  Alcohol Use Not Currently     Social History   Substance and Sexual Activity  Drug Use Yes   Comment: CBD - hump    Social History   Socioeconomic History  . Marital status: Unknown    Spouse name: Not on file  . Number of children: Not on file  . Years of education: Not on file  . Highest education level: Not on file  Occupational History  . Not on file  Social Needs  . Financial resource strain: Not on file  . Food insecurity:    Worry: Not on file    Inability: Not on file  . Transportation needs:    Medical: Not on file    Non-medical: Not on file  Tobacco Use  . Smoking status: Current Some Day Smoker    Packs/day: 0.25    Years: 16.00    Pack years: 4.00    Types: Cigarettes  . Smokeless  tobacco: Never Used  Substance and Sexual Activity  . Alcohol use: Not Currently  . Drug use: Yes    Comment: CBD - hump  . Sexual activity: Not on file  Lifestyle  . Physical activity:    Days per week: Not on file    Minutes per session: Not on file  . Stress: Not on file  Relationships  . Social connections:    Talks on phone: Not on file    Gets together: Not on file    Attends religious service: Not on file    Active member of club or organization: Not on file    Attends meetings of clubs or organizations: Not on file    Relationship status: Not on file  Other Topics Concern  . Not on file  Social History Narrative  . Not on file    Hospital Course:  From admission assessment: Hannah Armstrong is an 37 y.o. female that presents this date with IVC. Per IVC: "[name] is ADHD, she is responding to voices that are coming out of the TV when it isn't on. She is hearing a phone ring that isn't there, [name] is standing over family members when they sleep." This writer spoke with patient at 0900 hours and observed the patient to be very disorganized and unable to render history. Patient is speaking in word salad at times and is very animated drawing  pictures in the air with her finger. This writer attempts to redirect unsuccessfully. EDP spoke with patient's mother on admission. Per that note, "History is somewhat limited due to the quality of the reception during the phone call.Reports that the patient was previously diagnosed with manic depression and ADHD. She was prescribed medications, but she does not like to take them and has not taken them in some time. She reports multiple inpatient behavioral health admissions when the patient was living in [place]. Reports the patient really has not eaten in 4 days. She has not been sleeping well over the last few days. The patient's mother notes that she has seemed very paranoid. She gives the example that she seems to "jump" if  youmove towards or too fast. Also notes that she has been "jumpy" with loud noises.Patient's mother reports that she seems to have been very overprotective of her daughter over the last few days. She also notes that the patient has been signing with her hands instead of talking to respond. Hannah Armstrong states "if you tell her to do something she will do the opposite". Hannah Armstrong deniesthat she has heard the patient endorses SI, HI.Reports that she seems to have been hallucinating. The patient relocated from [place] 5 months ago. This is an initial appointment with a provider in [place].  From admission H&P: Hannah Armstrong is 38 years of age she is unable to give me much of a history she presented under petition for involuntary commitment and she was described as paranoid and disorganized numbering from auditory hallucinations as well.  Her drug screen is positive for amphetamines and cannabis but she will not answer questions meaningfully regarding a past psych history drug use so forth.  She is quite hyper religious disorganized throws her arms in the air stating "they want to hear the truth" and requires IM medications that she will not cooperate even with going in the exam room for search and interview. Currently alert oriented to person as best I can tell would not answer other questions would not participate fully mental status exam testing rambling pressured hyper religious and disorganized without acute thoughts of harming self endorsed when asked specifically but will not elaborate just shakes her head.  Hannah Armstrong was admitted for psychotic symptoms as described above. She was agitated and irritable. Haldol was started but patient refused to take this medication consistently. She was started on Risperdal. She received Abilify Maintena 400 mg IM on 10/27/18. She participated in group therapy on the unit. She remained on the [facility] unit for 4 days. She stabilized with medication and therapy. She was  discharged on the medications listed below. She has shown improvement with improved mood, affect, sleep, appetite, and interaction. She denies any SI/HI/AVH and contracts for safety. She agrees to follow up at [facility] (see below). Patient is provided with prescriptions for medications upon discharge. She is discharging home with mother via bus.  Physical Findings: AIMS:  , ,  ,  ,    CIWA:    COWS:     Musculoskeletal: Strength & Muscle Tone: within normal limits Gait & Station: normal Patient leans: N/A  Psychiatric Specialty Exam: Physical Exam  Nursing note and vitals reviewed. Constitutional: She is oriented to person, place, and time. She appears well-developed and well-nourished.  Cardiovascular: Normal rate.  Respiratory: Effort normal.  Neurological: She is alert and oriented to person, place, and time.    Review of Systems  Constitutional: Negative.   Psychiatric/Behavioral: Positive for substance abuse (UDS +  THC, amphetamines). Negative for depression, hallucinations and suicidal ideas. The patient is not nervous/anxious and does not have insomnia.     Blood pressure (!) 107/56, pulse 76, temperature (!) 97.3 F (36.3 C), temperature source Oral.There is no height or weight on file to calculate BMI.  See MD's discharge SRA        Has this patient used any form of tobacco in the last 30 days? (Cigarettes, Smokeless Tobacco, Cigars, and/or Pipes)  No  Blood Alcohol level:  Lab Results  Component Value Date   ETH <10 10/23/2018    Metabolic Disorder Labs:  Lab Results  Component Value Date   HGBA1C 5.6 07/10/2018   No results found for: PROLACTIN Lab Results  Component Value Date   CHOL 121 07/10/2018   TRIG 39 07/10/2018   HDL 36 (L) 07/10/2018   CHOLHDL 3.4 07/10/2018   LDLCALC 77 07/10/2018    See Psychiatric Specialty Exam and Suicide Risk Assessment completed by Attending Physician prior to discharge.  Discharge destination:  Home  Is patient on  multiple antipsychotic therapies at discharge:  Yes,   Do you recommend tapering to monotherapy for antipsychotics?  Yes   Has Patient had three or more failed trials of antipsychotic monotherapy by history:  Yes,   Antipsychotic medications that previously failed include:   1.  Haldol., 2.  Perphenazine. and 3.  Risperdal.  Recommended Plan for Multiple Antipsychotic Therapies: Taper to monotherapy as described:  Patient's outpatient psychiatrist may taper to antipsychotic monotherapy as patient's symptoms allow. Abilify Maintena 400 mg was given on 10/27/18.   Allergies as of 10/27/2018   No Known Allergies     Medication List    STOP taking these medications   SUMAtriptan 50 MG tablet Commonly known as:  Imitrex     TAKE these medications     Indication  ARIPiprazole ER 400 MG Srer injection Commonly known as:  ABILIFY MAINTENA Inject 2 mLs (400 mg total) into the muscle every 28 (twenty-eight) days. Due 6/15  Indication:  MIXED BIPOLAR AFFECTIVE DISORDER   benztropine 0.5 MG tablet Commonly known as:  COGENTIN Take 1 tablet (0.5 mg total) by mouth 2 (two) times daily.  Indication:  Extrapyramidal Reaction caused by Medications   levothyroxine 75 MCG tablet Commonly known as:  SYNTHROID Take 1 tablet (75 mcg total) by mouth daily before breakfast.  Indication:  Underactive Thyroid   propranolol 10 MG tablet Commonly known as:  INDERAL Take 1 tablet (10 mg total) by mouth 2 (two) times daily.  Indication:  Migraine Headache   risperiDONE 3 MG tablet Commonly known as:  RISPERDAL Take 1 tablet (3 mg total) by mouth 3 (three) times daily.  Indication:  Hypomanic Episode of Bipolar Disorder   temazepam 30 MG capsule Commonly known as:  RESTORIL Take 1 capsule (30 mg total) by mouth at bedtime.  Indication:  Trouble Sleeping   topiramate 25 MG tablet Commonly known as:  Topamax Take 1 tablet (25 mg total) by mouth at bedtime.  Indication:  Migraine Headache       Follow-up Information    [facility] Follow up on 10/31/2018.   Why:  Your telephone intake appt is Tuesday, 10/31/18, at 1030am.  Please be ready to receive the phone call at that time. Contact information: [address] [place] [address] [phone]           Follow-up recommendations: Activity as tolerated. Diet as recommended by primary care physician. Keep all scheduled follow-up appointments  as recommended.   Comments:   Patient is instructed to take all prescribed medications as recommended. Report any side effects or adverse reactions to your outpatient psychiatrist. Patient is instructed to abstain from alcohol and illegal drugs while on prescription medications. In the event of worsening symptoms, patient is instructed to call the crisis hotline, 911, or go to the nearest emergency department for evaluation and treatment.  Signed: Aldean BakerJanet E Zykiria Bruening, NP 10/27/2018, 1:27 PM

## 2018-10-27 NOTE — Progress Notes (Signed)
Pt discharged to lobby. Pt was stable and appreciative at that time. All papers and prescriptions were given and valuables returned. Verbal understanding expressed. Denies SI/HI and A/VH. Pt given opportunity to express concerns and ask questions.  

## 2018-11-16 ENCOUNTER — Telehealth: Payer: Self-pay

## 2018-11-16 NOTE — Telephone Encounter (Addendum)
Attempted to contact the patient to inquire if she kept her appointment at [facility] on 10/31/2018 and to schedule an appointment with her PCP. Calls placed to # [phone] and [phone] and messages were left on both numbers requesting call back from the patient to this CM # [phone].  Call placed to [facility] # [phone]. to determine if patient kept her appointment  and inquire if she will have her behavioral health medications prescribed through [facility]. Spoke to Elaine who explained that the patient called and spoke to the referral specialist on 10/31/2018, she was scheduled for an appointment on 11/10/2018 with the doctor who would prescribed her medications and she was a no show.  Misty Stanley said that the patient will need to call [facility] at the above noted number to reschedule her appointment

## 2018-11-27 ENCOUNTER — Other Ambulatory Visit: Payer: Medicaid Other

## 2018-12-29 ENCOUNTER — Telehealth: Payer: Self-pay | Admitting: Licensed Clinical Social Worker

## 2018-12-29 NOTE — Telephone Encounter (Signed)
Call placed to patient to follow up on consult to address behavioral health and/or resource needs. LCSW left detailed message requesting a call back.

## 2019-01-11 ENCOUNTER — Other Ambulatory Visit: Payer: Self-pay | Admitting: Internal Medicine

## 2019-01-11 DIAGNOSIS — E039 Hypothyroidism, unspecified: Secondary | ICD-10-CM

## 2019-01-22 ENCOUNTER — Other Ambulatory Visit: Payer: Self-pay | Admitting: Internal Medicine

## 2019-01-22 DIAGNOSIS — E039 Hypothyroidism, unspecified: Secondary | ICD-10-CM

## 2019-02-10 ENCOUNTER — Other Ambulatory Visit: Payer: Self-pay | Admitting: Internal Medicine

## 2019-02-10 DIAGNOSIS — G43509 Persistent migraine aura without cerebral infarction, not intractable, without status migrainosus: Secondary | ICD-10-CM

## 2019-03-14 ENCOUNTER — Other Ambulatory Visit: Payer: Self-pay | Admitting: Internal Medicine

## 2019-03-14 DIAGNOSIS — G43509 Persistent migraine aura without cerebral infarction, not intractable, without status migrainosus: Secondary | ICD-10-CM

## 2019-09-26 ENCOUNTER — Emergency Department (HOSPITAL_COMMUNITY)
Admission: EM | Admit: 2019-09-26 | Discharge: 2019-09-27 | Disposition: A | Discharge status: Home / Self Care | Payer: Medicaid Other | Attending: Emergency Medicine | Admitting: Emergency Medicine

## 2019-09-26 ENCOUNTER — Encounter (HOSPITAL_COMMUNITY): Payer: Self-pay

## 2019-09-26 DIAGNOSIS — F17210 Nicotine dependence, cigarettes, uncomplicated: Secondary | ICD-10-CM | POA: Diagnosis not present

## 2019-09-26 DIAGNOSIS — R110 Nausea: Secondary | ICD-10-CM

## 2019-09-26 DIAGNOSIS — E039 Hypothyroidism, unspecified: Secondary | ICD-10-CM | POA: Diagnosis not present

## 2019-09-26 DIAGNOSIS — Z79899 Other long term (current) drug therapy: Secondary | ICD-10-CM | POA: Diagnosis not present

## 2019-09-26 DIAGNOSIS — Z7185 Encounter for immunization safety counseling: Secondary | ICD-10-CM

## 2019-09-26 DIAGNOSIS — Z7189 Other specified counseling: Secondary | ICD-10-CM | POA: Diagnosis not present

## 2019-09-26 LAB — CBC
HCT: 38.9 % (ref 36.0–46.0)
Hemoglobin: 12.7 g/dL (ref 12.0–15.0)
MCH: 30.8 pg (ref 26.0–34.0)
MCHC: 32.6 g/dL (ref 30.0–36.0)
MCV: 94.2 fL (ref 80.0–100.0)
Platelets: 322 10*3/uL (ref 150–400)
RBC: 4.13 MIL/uL (ref 3.87–5.11)
RDW: 14.2 % (ref 11.5–15.5)
WBC: 12.8 10*3/uL — ABNORMAL HIGH (ref 4.0–10.5)
nRBC: 0 % (ref 0.0–0.2)

## 2019-09-26 LAB — URINALYSIS, ROUTINE W REFLEX MICROSCOPIC
Bilirubin Urine: NEGATIVE
Glucose, UA: NEGATIVE mg/dL
Hgb urine dipstick: NEGATIVE
Ketones, ur: NEGATIVE mg/dL
Leukocytes,Ua: NEGATIVE
Nitrite: NEGATIVE
Protein, ur: NEGATIVE mg/dL
Specific Gravity, Urine: 1.012 (ref 1.005–1.030)
pH: 7 (ref 5.0–8.0)

## 2019-09-26 LAB — BASIC METABOLIC PANEL
Anion gap: 10 (ref 5–15)
BUN: 11 mg/dL (ref 6–20)
CO2: 25 mmol/L (ref 22–32)
Calcium: 9.2 mg/dL (ref 8.9–10.3)
Chloride: 101 mmol/L (ref 98–111)
Creatinine, Ser: 1.13 mg/dL — ABNORMAL HIGH (ref 0.44–1.00)
GFR calc Af Amer: >60 mL/min (ref >60)
GFR calc non Af Amer: >60 mL/min (ref >60)
Glucose, Bld: 99 mg/dL (ref 70–99)
Potassium: 3.7 mmol/L (ref 3.5–5.1)
Sodium: 136 mmol/L (ref 135–145)

## 2019-09-26 LAB — I-STAT BETA HCG BLOOD, ED (MC, WL, AP ONLY): I-stat hCG, quantitative: <5 m[IU]/mL (ref <5)

## 2019-09-26 MED ORDER — SODIUM CHLORIDE 0.9% FLUSH: 3.0000 mL | Freq: Once | INTRAVENOUS | Status: DC

## 2019-09-26 NOTE — ED Triage Notes (Signed)
Pt received her first pfizer shot today and began to have dizziness afterwards, one episode of vomiting.

## 2019-09-27 LAB — CBG MONITORING, ED: Glucose-Capillary: 94 mg/dL (ref 70–99)

## 2019-09-27 NOTE — ED Notes (Signed)
No response in waiting to take to treatment room.

## 2019-09-27 NOTE — ED Provider Notes (Signed)
[facility] EMERGENCY DEPARTMENT Provider Note   CSN: [id number] Arrival date & time: 09/26/19  2039     History Chief Complaint  Patient presents with  . Dizziness    Hannah Armstrong is a 39 y.o. female.  HPI    39 year old female with nausea.  Patient received her first dose of the Pfizer Covid vaccine yesterday.  Approximately 20 minutes later she began to feel nauseated.  Vague sense of dizziness.  She states that she watches a lot of TV and she became concerned because she had been hearing about bad reactions some people have been having possibly related to these vaccines.  She reports underlying anxiety and she lives by herself.  She felt that it would be best to be evaluated.  Unfortunately, there was an extended ER wait time.  She has been here approximately 10 hours now.  Thankfully she feels much better now and is actually asymptomatic.  History reviewed. No pertinent past medical history.  Patient Active Problem List   Diagnosis Date Noted  . Bipolar affective disorder, current episode manic with psychotic symptoms (HCC) 10/23/2018  . Schizoaffective disorder (HCC) 10/23/2018  . Schizoaffective disorder, bipolar type (HCC)   . Acquired hypothyroidism 07/10/2018  . Persistent migraine aura without cerebral infarction and without status migrainosus, not intractable 07/10/2018  . Morbid obesity (HCC) 07/10/2018  . Tobacco dependence 07/10/2018    Past Surgical History:  Procedure Laterality Date  . ANTERIOR CRUCIATE LIGAMENT (ACL) REVISION Right 2016 Nov     OB History   No obstetric history on file.     Family History  Problem Relation Age of Onset  . Hypertension Mother   . Breast cancer Sister   . Diabetes Maternal Aunt   . Sickle cell anemia Paternal Aunt   . Breast cancer Paternal Aunt     Social History   Tobacco Use  . Smoking status: Current Some Day Smoker    Packs/day: 0.25    Years: 16.00    Pack years: 4.00   Types: Cigarettes  . Smokeless tobacco: Never Used  Substance Use Topics  . Alcohol use: Not Currently  . Drug use: Yes    Comment: CBD - hump    Home Medications Prior to Admission medications   Medication Sig Start Date End Date Taking? Authorizing Provider  levothyroxine (SYNTHROID) 50 MCG tablet Take 50 mcg by mouth daily before breakfast.   Yes [provider]  ARIPiprazole ER (ABILIFY MAINTENA) 400 MG SRER injection Inject 2 mLs (400 mg total) into the muscle every 28 (twenty-eight) days. Due 6/15 Patient not taking: Reported on 09/26/2019 10/27/18   Malvin Johns, MD  benztropine (COGENTIN) 0.5 MG tablet Take 1 tablet (0.5 mg total) by mouth 2 (two) times daily. Patient not taking: Reported on 09/26/2019 10/27/18   Malvin Johns, MD  levothyroxine (SYNTHROID, LEVOTHROID) 75 MCG tablet Take 1 tablet (75 mcg total) by mouth daily before breakfast. Patient not taking: Reported on 09/26/2019 07/10/18   Marcine Matar, MD  propranolol (INDERAL) 10 MG tablet Take 1 tablet (10 mg total) by mouth 2 (two) times daily. Patient not taking: Reported on 09/26/2019 10/27/18   Malvin Johns, MD  risperiDONE (RISPERDAL) 3 MG tablet Take 1 tablet (3 mg total) by mouth 3 (three) times daily. Patient not taking: Reported on 09/26/2019 10/27/18   Malvin Johns, MD  temazepam (RESTORIL) 30 MG capsule Take 1 capsule (30 mg total) by mouth at bedtime. Patient not taking: Reported on 09/26/2019 10/27/18  Malvin Johns, MD  topiramate (TOPAMAX) 25 MG tablet Take 1 tablet (25 mg total) by mouth at bedtime. Patient not taking: Reported on 09/26/2019 07/10/18   Marcine Matar, MD    Allergies    Patient has no known allergies.  Review of Systems   Review of Systems All systems reviewed and negative, other than as noted in HPI.  Physical Exam Updated Vital Signs BP 118/66   Pulse (!) 52   Temp 98.1 F (36.7 C)   Resp 15   SpO2 99%   Physical Exam Vitals and nursing note reviewed.    Constitutional:      General: She is not in acute distress.    Appearance: She is well-developed. She is obese.  HENT:     Head: Normocephalic and atraumatic.  Eyes:     General:        Right eye: No discharge.        Left eye: No discharge.     Conjunctiva/sclera: Conjunctivae normal.  Cardiovascular:     Rate and Rhythm: Normal rate and regular rhythm.     Heart sounds: Normal heart sounds. No murmur. No friction rub. No gallop.   Pulmonary:     Effort: Pulmonary effort is normal. No respiratory distress.     Breath sounds: Normal breath sounds.  Abdominal:     General: There is no distension.     Palpations: Abdomen is soft.     Tenderness: There is no abdominal tenderness.  Musculoskeletal:        General: No tenderness.     Cervical back: Neck supple.  Skin:    General: Skin is warm and dry.  Neurological:     Mental Status: She is alert.  Psychiatric:        Behavior: Behavior normal.        Thought Content: Thought content normal.     ED Results / Procedures / Treatments   Labs (all labs ordered are listed, but only abnormal results are displayed) Labs Reviewed  BASIC METABOLIC PANEL - Abnormal; Notable for the following components:      Result Value   Creatinine, Ser 1.13 (*)    All other components within normal limits  CBC - Abnormal; Notable for the following components:   WBC 12.8 (*)    All other components within normal limits  URINALYSIS, ROUTINE W REFLEX MICROSCOPIC  CBG MONITORING, ED  I-STAT BETA HCG BLOOD, ED ([facility], [facility], [facility] ONLY)    EKG EKG Interpretation  Date/Time:  Thursday September 27 2019 06:55:52 EDT Ventricular Rate:  53 PR Interval:  178 QRS Duration: 96 QT Interval:  446 QTC Calculation: 419 R Axis:   87 Text Interpretation: Sinus rhythm Borderline prolonged PR interval Confirmed by Raeford Razor [phone]) on 09/27/2019 7:05:10 AM   Radiology No results found.  Procedures Procedures (including critical care time)  Medications  Ordered in ED Medications  sodium chloride flush (NS) 0.9 % injection 3 mL (has no administration in time range)    ED Course  I have reviewed the triage vital signs and the nursing notes.  Pertinent labs & imaging results that were available during my care of the patient were reviewed by me and considered in my medical decision making (see chart for details).    MDM Rules/Calculators/A&P                      39 year old female with nausea, now resolved, after getting the first dose of  the [employer] Covid vaccine yesterday.  Even no symptoms related to the vaccine, they were fairly mild.  Her work-up including labs and EKG are largely reassuring.  She does admit to some anxiety component.  We spent some time going over the indications for the vaccine and the basics of how they work in the body. I discussed my personal experience with some of the symptoms I had with the same vaccine.  Most of the time was spent trying to reassure her.  Return precautions were discussed.  Final Clinical Impression(s) / ED Diagnoses Final diagnoses:  Nausea  Vaccine counseling    Rx / DC Orders ED Discharge Orders    None       Virgel Manifold, MD 09/27/19 [phone]

## 2021-02-08 ENCOUNTER — Telehealth (HOSPITAL_COMMUNITY): Payer: Self-pay | Admitting: Student

## 2021-02-08 ENCOUNTER — Emergency Department (HOSPITAL_COMMUNITY): Payer: Medicaid Other

## 2021-02-08 ENCOUNTER — Emergency Department (HOSPITAL_COMMUNITY)
Admission: EM | Admit: 2021-02-08 | Discharge: 2021-02-08 | Disposition: A | Discharge status: Home / Self Care | Payer: Medicaid Other | Attending: Emergency Medicine | Admitting: Emergency Medicine

## 2021-02-08 ENCOUNTER — Other Ambulatory Visit: Payer: Self-pay

## 2021-02-08 ENCOUNTER — Encounter (HOSPITAL_COMMUNITY): Payer: Self-pay

## 2021-02-08 DIAGNOSIS — E039 Hypothyroidism, unspecified: Secondary | ICD-10-CM | POA: Insufficient documentation

## 2021-02-08 DIAGNOSIS — Z79899 Other long term (current) drug therapy: Secondary | ICD-10-CM | POA: Diagnosis not present

## 2021-02-08 DIAGNOSIS — F17210 Nicotine dependence, cigarettes, uncomplicated: Secondary | ICD-10-CM | POA: Insufficient documentation

## 2021-02-08 DIAGNOSIS — M25562 Pain in left knee: Secondary | ICD-10-CM

## 2021-02-08 MED ORDER — HYDROCODONE-ACETAMINOPHEN 5-325 MG PO TABS: 1.0000 | ORAL_TABLET | Freq: Four times a day (QID) | ORAL | 0 refills | Status: DC | PRN

## 2021-02-08 MED ORDER — OXYCODONE-ACETAMINOPHEN 5-325 MG PO TABS
1.0000 | ORAL_TABLET | Freq: Once | ORAL | Status: AC
  Administered 2021-02-08: 1 via ORAL
  Filled 2021-02-08: qty 1

## 2021-02-08 MED ORDER — CYCLOBENZAPRINE HCL 10 MG PO TABS: 10.0000 mg | ORAL_TABLET | Freq: Two times a day (BID) | ORAL | 0 refills | Status: DC | PRN

## 2021-02-08 MED ORDER — KETOROLAC TROMETHAMINE 15 MG/ML IJ SOLN
15.0000 mg | Freq: Once | INTRAMUSCULAR | Status: AC
  Administered 2021-02-08: 15 mg via INTRAMUSCULAR
  Filled 2021-02-08: qty 1

## 2021-02-08 NOTE — ED Provider Notes (Signed)
Emergency Medicine Provider Triage Evaluation Note  Hannah Armstrong , a 40 y.o. female  was evaluated in triage.  Pt complains of left knee pain.  Patient reports history of right knee ACL surgery 6 years ago, patient reports that she has been favoring her leg since that time.  She reports intermittent left knee pain for the past 6 years, worse last night, she is unable to with OTC anti-inflammatories like a normal.  She feels that her knee is somewhat swollen.  Pain is primarily over the medial joint line.  Review of Systems  Positive: Pain and swelling Negative: Fever/chills, fall/injury, lower swelling, calf tenderness, numbness/weakness additional concerns  Physical Exam  BP (!) 136/100   Pulse 84   Temp 99 F (37.2 C) (Oral)   Resp 18   LMP 02/08/2021   SpO2 100%  Gen:   Awake, no distress   Resp:  Normal effort  MSK:   Left knee: No evidence for injury, no skin break or deformity.  Mild TTP over the medial joint line and no TTP of the popliteal fossa, lateral joint line or overlying the patella.  Flexion extension intact with some increased pain.  Pedal pulses and capillary refill intact distally.  No calf pain or swelling.   Medical Decision Making  Medically screening exam initiated at 11:40 AM.  Appropriate orders placed.  Hannah Armstrong was informed that the remainder of the evaluation will be completed by another provider, this initial triage assessment does not replace that evaluation, and the importance of remaining in the ED until their evaluation is complete.   Note: Portions of this report may have been transcribed using voice recognition software. Every effort was made to ensure accuracy; however, inadvertent computerized transcription errors may still be present.    Bill Salinas, PA-C 02/08/21 1141    Terald Sleeper, MD 02/08/21 1416

## 2021-02-08 NOTE — Telephone Encounter (Signed)
Patient seen in the emergency department earlier today and was prescribed hydrocodone and Flexeril, but was unable to get these from the pharmacy, was requested to send these medications be sent in to a different pharmacy.  On review of [place] narcotic database patient received 30-day supply of hydrocodone on the 17th, so I will not send an additional supply of this medication but will prescribe Flexeril.

## 2021-02-08 NOTE — ED Provider Notes (Signed)
[facility] EMERGENCY DEPARTMENT Provider Note   CSN: [id number] Arrival date & time: 02/08/21  1034     History Chief Complaint  Patient presents with   Knee Pain    Hannah Armstrong is a 40 y.o. female with a history of right knee ACL and injury reports today complaining of left knee pain. She reports she began to over-favor her left leg at the time of her surgery 8 years ago. She woke up last night in excruciating pain on straightening the left leg.  She does not recall a specific mechanical injury, has not heard a pop.  She has tried round-the-clock ibuprofen for the last several weeks to months reports that it was working, but no longer is helping.  She understands that she needs further evaluation by orthopedics but is try to get her pain in control today so that she can make it through the weekend and seek further evaluation.   Knee Pain     History reviewed. No pertinent past medical history.  Patient Active Problem List   Diagnosis Date Noted   Bipolar affective disorder, current episode manic with psychotic symptoms (HCC) 10/23/2018   Schizoaffective disorder (HCC) 10/23/2018   Schizoaffective disorder, bipolar type (HCC)    Acquired hypothyroidism 07/10/2018   Persistent migraine aura without cerebral infarction and without status migrainosus, not intractable 07/10/2018   Morbid obesity (HCC) 07/10/2018   Tobacco dependence 07/10/2018    Past Surgical History:  Procedure Laterality Date   ANTERIOR CRUCIATE LIGAMENT (ACL) REVISION Right 2016 Nov     OB History   No obstetric history on file.     Family History  Problem Relation Age of Onset   Hypertension Mother    Breast cancer Sister    Diabetes Maternal Aunt    Sickle cell anemia Paternal Aunt    Breast cancer Paternal Aunt     Social History   Tobacco Use   Smoking status: Some Days    Packs/day: 0.25    Years: 16.00    Pack years: 4.00    Types: Cigarettes    Smokeless tobacco: Never  Vaping Use   Vaping Use: Never used  Substance Use Topics   Alcohol use: Not Currently   Drug use: Yes    Comment: CBD - hump    Home Medications Prior to Admission medications   Medication Sig Start Date End Date Taking? Authorizing Provider  cyclobenzaprine (FLEXERIL) 10 MG tablet Take 1 tablet (10 mg total) by mouth 2 (two) times daily as needed for muscle spasms. 02/08/21  Yes Shalah Estelle H, PA-C  HYDROcodone-acetaminophen (NORCO/VICODIN) 5-325 MG tablet Take 1-2 tablets by mouth every 6 (six) hours as needed. 02/08/21  Yes Sion Reinders H, PA-C  ARIPiprazole ER (ABILIFY MAINTENA) 400 MG SRER injection Inject 2 mLs (400 mg total) into the muscle every 28 (twenty-eight) days. Due 6/15 Patient not taking: Reported on 09/26/2019 10/27/18   Malvin Johns, MD  benztropine (COGENTIN) 0.5 MG tablet Take 1 tablet (0.5 mg total) by mouth 2 (two) times daily. Patient not taking: Reported on 09/26/2019 10/27/18   Malvin Johns, MD  levothyroxine (SYNTHROID) 50 MCG tablet Take 50 mcg by mouth daily before breakfast.    [provider]  levothyroxine (SYNTHROID, LEVOTHROID) 75 MCG tablet Take 1 tablet (75 mcg total) by mouth daily before breakfast. Patient not taking: Reported on 09/26/2019 07/10/18   Marcine Matar, MD  propranolol (INDERAL) 10 MG tablet Take 1 tablet (10 mg total) by mouth 2 (  two) times daily. Patient not taking: Reported on 09/26/2019 10/27/18   Malvin Johns, MD  risperiDONE (RISPERDAL) 3 MG tablet Take 1 tablet (3 mg total) by mouth 3 (three) times daily. Patient not taking: Reported on 09/26/2019 10/27/18   Malvin Johns, MD  temazepam (RESTORIL) 30 MG capsule Take 1 capsule (30 mg total) by mouth at bedtime. Patient not taking: Reported on 09/26/2019 10/27/18   Malvin Johns, MD  topiramate (TOPAMAX) 25 MG tablet Take 1 tablet (25 mg total) by mouth at bedtime. Patient not taking: Reported on 09/26/2019 07/10/18   Marcine Matar, MD     Allergies    Patient has no known allergies.  Review of Systems   Review of Systems  Musculoskeletal:  Positive for arthralgias and gait problem.   Physical Exam Updated Vital Signs BP (!) 136/100   Pulse 84   Temp 99 F (37.2 C) (Oral)   Resp 18   LMP 02/08/2021   SpO2 100%   Physical Exam Vitals and nursing note reviewed.  Constitutional:      General: She is not in acute distress.    Appearance: Normal appearance.  HENT:     Head: Normocephalic and atraumatic.  Eyes:     General:        Right eye: No discharge.        Left eye: No discharge.  Cardiovascular:     Rate and Rhythm: Normal rate and regular rhythm.     Comments: Distal DP and PT pulses intact on bilateral lower extremities Pulmonary:     Effort: Pulmonary effort is normal. No respiratory distress.  Musculoskeletal:        General: No deformity.     Comments: Tenderness to palpation of lateral and medial joint space of left knee and patella.  No tenderness to palpation of quadriceps or patellar tendon.  No anterior, posterior drawer laxity.  No varus or valgus stress laxity.  No obvious effusion, some swelling.  No heat, excessive tenderness suggestive of septic joint.  Skin:    General: Skin is warm and dry.  Neurological:     Mental Status: She is alert and oriented to person, place, and time.  Psychiatric:        Mood and Affect: Mood normal.        Behavior: Behavior normal.    ED Results / Procedures / Treatments   Labs (all labs ordered are listed, but only abnormal results are displayed) Labs Reviewed - No data to display  EKG None  Radiology DG Knee Complete 4 Views Left  Result Date: 02/08/2021 CLINICAL DATA:  Progressively worsening chronic left knee pain. EXAM: LEFT KNEE - COMPLETE 4+ VIEW COMPARISON:  None. FINDINGS: No acute fracture or dislocation. No joint effusion. Joint spaces are preserved. Tiny marginal medial compartment osteophytes. Bone mineralization is normal. Soft  tissues are unremarkable. IMPRESSION: 1.  No acute osseous abnormality. 2. Early medial compartment osteoarthritis. Electronically Signed   By: Obie Dredge M.D.   On: 02/08/2021 13:08    Procedures Procedures   Medications Ordered in ED Medications  ketorolac (TORADOL) 15 MG/ML injection 15 mg (has no administration in time range)  oxyCODONE-acetaminophen (PERCOCET/ROXICET) 5-325 MG per tablet 1 tablet (has no administration in time range)    ED Course  I have reviewed the triage vital signs and the nursing notes.  Pertinent labs & imaging results that were available during my care of the patient were reviewed by me and considered in my medical decision making (  see chart for details).    MDM Rules/Calculators/A&P                         Patient understands that she has an acute on chronic exacerbation of existing left knee pain.  She is requesting temporary pain relief in the context of needing further evaluation by orthopedics.  Her x-ray evaluation is negative for any bony abnormality, shows some arthritic changes.  Discussed patient would likely benefit from an intra-articular steroid injection, and MRI evaluation by orthopedics which she agrees with.  The time being we will focus on pain control, patient given Toradol injection, Percocet today.  We will send with muscle relaxant for pain and sleep, and a few days of Norco to help patient make it to her next appointment.  Discussed importance of follow-up with orthopedics, and our interventions today are not a solution to her pain.  Offered prednisone Dosepak in lieu of pain control, patient will hold off for intra-articular injection and requests medications we discussed.  Final Clinical Impression(s) / ED Diagnoses Final diagnoses:  Acute pain of left knee    Rx / DC Orders ED Discharge Orders          Ordered    HYDROcodone-acetaminophen (NORCO/VICODIN) 5-325 MG tablet  Every 6 hours PRN        02/08/21 1437     cyclobenzaprine (FLEXERIL) 10 MG tablet  2 times daily PRN        02/08/21 1437             Byan Poplaski, Harrel Carina, PA-C 02/08/21 1447    Lorre Nick, MD 02/10/21 1515

## 2021-02-08 NOTE — Discharge Instructions (Signed)
Follow-up with orthopedics at your earliest convenience for an MRI, intra-articular injection as we discussed.  In addition to the pain medication that I sent for you I recommend that you continue to use ibuprofen and Tylenol as this will add to the pain control of the other medications.  Please do not hesitate to return if the problem worsens or fails to improve.

## 2021-02-08 NOTE — ED Triage Notes (Signed)
Pt reports left knee pain for a while now, hx of surgery on right knee in 2016 and states she has been bearing more weight on the left so over time it has just gotten worse. Pt denies any specific injury or trauma

## 2021-02-08 NOTE — ED Notes (Signed)
Called social work to notify of order for transportation help once discharged.

## 2021-02-08 NOTE — Care Management (Addendum)
Consult for DME and transportation. Patient ordered cane [employer] called and they will deliver to room prior to discharge. Nurse securechatted to inform her.of delivery  1512 RN messaged back and stated they are using crutches, cane cancelled via [employer]

## 2021-02-08 NOTE — ED Notes (Signed)
Reviewed discharge instructions with patient. Follow-up care and medications reviewed. Patient  verbalized understanding. Patient A&Ox4, VSS, and ambulatory with steady gait upon discharge.  °

## 2021-02-08 NOTE — ED Notes (Signed)
Called case management and left voicemail regarding trying to get a cane for pt

## 2021-02-08 NOTE — Care Management (Signed)
Patient had ust left the ED and went to pick up medications prescribed, [facility] stated that the provider was not medicaid approved , spoke to the provider via chat and the new provider on shift via phone, Adelina Mings  PA, She agreed to call in Flexiril to [facility], however, the patient has just had a 30 day supply of narcotic from a MD, and this will not be filled. Called the patient back and let her know the flexaril was sent to [facility]

## 2021-02-13 ENCOUNTER — Ambulatory Visit (INDEPENDENT_AMBULATORY_CARE_PROVIDER_SITE_OTHER): Payer: Medicaid Other | Admitting: Physician Assistant

## 2021-02-13 ENCOUNTER — Other Ambulatory Visit: Payer: Self-pay

## 2021-02-13 DIAGNOSIS — M1712 Unilateral primary osteoarthritis, left knee: Secondary | ICD-10-CM

## 2021-02-13 MED ORDER — DICLOFENAC SODIUM 75 MG PO TBEC: 75.0000 mg | DELAYED_RELEASE_TABLET | Freq: Two times a day (BID) | ORAL | 3 refills | Status: AC | PRN

## 2021-02-13 NOTE — Progress Notes (Signed)
Office Visit Note   Patient: Hannah Armstrong           Date of Birth: 1981-03-08           MRN: [id number] Visit Date: 02/13/2021              Requested by: Carmel Sacramento, NP [address] [place],  [place] [address] PCP: Carmel Sacramento, NP  Chief Complaint  Patient presents with   Left Knee - Pain      HPI: Patient is a pleasant 40 year old woman who presents with a chief complaint of left knee pain.  She is a history of right ACL on her right knee.  She admits that she has probably relied on her left knee too much.  She denies any injuries.  She states that she woke up over the weekend with excruciating pain in her left knee and difficulty bearing weight.  She was seen and evaluated in the emergency room.  They gave her a shot of Toradol which did help a little bit.  They also prescribed a medication but because the physician was not a Medicaid provider she was unable to get this filled  Assessment & Plan: Visit Diagnoses:  1. Unilateral primary osteoarthritis, left knee     Plan: Early left knee arthritis cannot rule out a meniscus tear as she does have some locking catching mechanical symptoms.  I would like to go forward with an injection today but she has declined this.  Alternatively we could try a course of physical therapy as well as oral anti-inflammatories.  I will order this today.  If she decides she can come back next week to get the injection.  Otherwise follow-up in 4 weeks  Follow-Up Instructions: No follow-ups on file.   Ortho Exam  Patient is alert, oriented, no adenopathy, well-dressed, normal affect, normal respiratory effort. Examination demonstrates no effusion no swelling no heat.  She is focally tender on the medial joint line hurts with extension some tenderness lateral joint line.  Some grinding with range of motion good varus valgus stress no ligamentous instability no signs of infection  Imaging: No results found. No images are attached  to the encounter.  Labs: Lab Results  Component Value Date   HGBA1C 5.6 07/10/2018     Lab Results  Component Value Date   ALBUMIN 4.9 10/23/2018   ALBUMIN 4.4 07/10/2018    No results found for: MG No results found for: VD25OH  No results found for: PREALBUMIN CBC EXTENDED Latest Ref Rng & Units 09/26/2019 10/23/2018 07/10/2018  WBC 4.0 - 10.5 K/uL 12.8(H) 13.0(H) 8.1  RBC 3.87 - 5.11 MIL/uL 4.13 4.07 4.12  HGB 12.0 - 15.0 g/dL 09.2 33.0 07.6  HCT 22.6 - 46.0 % 38.9 38.4 37.3  PLT 150 - 400 K/uL 322 288 253     There is no height or weight on file to calculate BMI.  Orders:  Orders Placed This Encounter  Procedures   Ambulatory referral to Physical Therapy   Meds ordered this encounter  Medications   diclofenac (VOLTAREN) 75 MG EC tablet    Sig: Take 1 tablet (75 mg total) by mouth 2 (two) times daily as needed for mild pain or moderate pain. With meals    Dispense:  60 tablet    Refill:  3     Procedures: No procedures performed  Clinical Data: No additional findings.  ROS:  All other systems negative, except as noted in the HPI. Review of Systems  Objective:  Vital Signs: LMP 02/08/2021   Specialty Comments:  No specialty comments available.  PMFS History: Patient Active Problem List   Diagnosis Date Noted   Bipolar affective disorder, current episode manic with psychotic symptoms (HCC) 10/23/2018   Schizoaffective disorder (HCC) 10/23/2018   Schizoaffective disorder, bipolar type (HCC)    Acquired hypothyroidism 07/10/2018   Persistent migraine aura without cerebral infarction and without status migrainosus, not intractable 07/10/2018   Morbid obesity (HCC) 07/10/2018   Tobacco dependence 07/10/2018   No past medical history on file.  Family History  Problem Relation Age of Onset   Hypertension Mother    Breast cancer Sister    Diabetes Maternal Aunt    Sickle cell anemia Paternal Aunt    Breast cancer Paternal Aunt     Past Surgical  History:  Procedure Laterality Date   ANTERIOR CRUCIATE LIGAMENT (ACL) REVISION Right 2016 Nov   Social History   Occupational History   Not on file  Tobacco Use   Smoking status: Some Days    Packs/day: 0.25    Years: 16.00    Pack years: 4.00    Types: Cigarettes   Smokeless tobacco: Never  Vaping Use   Vaping Use: Never used  Substance and Sexual Activity   Alcohol use: Not Currently   Drug use: Yes    Comment: CBD - hump   Sexual activity: Not on file

## 2021-02-17 ENCOUNTER — Telehealth: Payer: Self-pay | Admitting: Physician Assistant

## 2021-02-17 NOTE — Telephone Encounter (Signed)
Looks like this was for diclofenac through cover my meds and have been directed to obtain auth through [place]  will hold and do this today.

## 2021-02-17 NOTE — Telephone Encounter (Signed)
Pt called and states her medication that was sent in needs prior auth. She is in a lot of pain and was suppose to have it Friday. She was wondering if that could be fixed today.

## 2021-02-17 NOTE — Telephone Encounter (Signed)
Can write order for Celebrex or mobic but needs to try and fail two preferred medications before we can try to to authorize Diclofenac

## 2021-02-18 ENCOUNTER — Other Ambulatory Visit: Payer: Self-pay | Admitting: Physician Assistant

## 2021-02-18 MED ORDER — MELOXICAM 15 MG PO TABS: 15.0000 mg | ORAL_TABLET | Freq: Every day | ORAL | 2 refills | Status: DC

## 2021-02-18 NOTE — Telephone Encounter (Signed)
I called and sw pt to advise of message below. Will call with any other questions.  

## 2021-02-18 NOTE — Telephone Encounter (Signed)
Done. thanks

## 2021-03-09 ENCOUNTER — Other Ambulatory Visit: Payer: Self-pay

## 2021-03-09 ENCOUNTER — Ambulatory Visit: Payer: Medicaid Other | Attending: Physician Assistant

## 2021-03-09 DIAGNOSIS — M25562 Pain in left knee: Secondary | ICD-10-CM | POA: Diagnosis not present

## 2021-03-09 DIAGNOSIS — M6281 Muscle weakness (generalized): Secondary | ICD-10-CM | POA: Insufficient documentation

## 2021-03-09 DIAGNOSIS — R262 Difficulty in walking, not elsewhere classified: Secondary | ICD-10-CM | POA: Insufficient documentation

## 2021-03-09 DIAGNOSIS — M25662 Stiffness of left knee, not elsewhere classified: Secondary | ICD-10-CM | POA: Diagnosis present

## 2021-03-09 DIAGNOSIS — G8929 Other chronic pain: Secondary | ICD-10-CM | POA: Insufficient documentation

## 2021-03-09 NOTE — Patient Instructions (Signed)
Pt encouraged to follow-up with her PCP regarding MRI of L knee to rule out meniscal pathology.

## 2021-03-09 NOTE — Therapy (Signed)
[facility] [address] [place], [place], [address] Phone: [phone]   Fax:  [phone]  Physical Therapy Evaluation  Patient Details  Name: Hannah Armstrong MRN: [id number] Date of Birth: [birth year] Referring Provider (PT): Persons, [place], [place]   Encounter Date: 03/09/2021   PT End of Session - 03/09/21 1331     Visit Number 1    Number of Visits 9    Date for PT Re-Evaluation 06/01/21    Authorization Type [place] [employer]    Authorization Time Period Pending Auth    PT Start Time 1215    PT Stop Time 1300    PT Time Calculation (min) 45 min    Activity Tolerance Patient tolerated treatment well;Patient limited by pain    Behavior During Therapy [facility] for tasks assessed/performed             History reviewed. No pertinent past medical history.  Past Surgical History:  Procedure Laterality Date   ANTERIOR CRUCIATE LIGAMENT (ACL) REVISION Right 2016 Nov    There were no vitals filed for this visit.    Subjective Assessment - 03/09/21 1219     Subjective Pt reports primary c/o chronic L knee pain of insidious onset lasting about 1 year. She also reports a hx of R knee ACL/ meniscal tear in 2014 and did not have surgical intervention until 2016. She reports onset of L knee pain about 1 year ago, which she reports is due to weight-bearing through her L knee over the past few years. She reports that she has X-rays, but believes she needs an MRI. She reports experiencing severe muscle spasms at night in her groin/ quads. She also reports occasional buckling and frequent popping in her L knee. She also reports that her knee has locked before, which led to an ED visit a few weeks ago. She denies any hx of L knee injuries. She denies any unexplained weight change, bowel or bladder changes, N/T, saddle anesthesia, or nausea/ vomiting.    Pertinent History ACL/ meniscus reconstruction in 2016    Limitations  Sitting;Standing;Walking;House hold activities;Lifting;Other (comment)   stairs   How long can you sit comfortably? 30 minutes to an hour    How long can you stand comfortably? 5 minutes    How long can you walk comfortably? 1 minute    Diagnostic tests 02/08/2021: L knee Juliet Rude: IMPRESSION:  1.  No acute osseous abnormality.  2. Early medial compartment osteoarthritis.    Patient Stated Goals Household chores, work without pain    Currently in Pain? Yes    Pain Score 0-No pain    Pain Location Knee    Pain Orientation Left    Aggravating Factors  Walking, standing, sitting    Pain Relieving Factors pain meds, ice, heat    Effect of Pain on Daily Activities Pain with household chores, work duties    Multiple Pain Sites No                [facility] PT Assessment - 03/09/21 0001       Assessment   Medical Diagnosis Unilateral primary osteoarthritis, left knee (M17.12)    Referring Provider (PT) Persons, [place], [place]    Onset Date/Surgical Date 03/09/20    Hand Dominance Left    Next MD Visit 03/27/2021 with PCP    Prior Therapy No      Precautions   Precautions None      Restrictions   Weight Bearing Restrictions No  Balance Screen   Has the patient fallen in the past 6 months No    Has the patient had a decrease in activity level because of a fear of falling?  No    Is the patient reluctant to leave their home because of a fear of falling?  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      Observation/Other Assessments   Observations Gross swelling to L global knee      Functional Tests   Functional tests Squat;Single leg stance      Squat    Comments Painful, audible pop on both knees on descent      Single Leg Stance   Comments WNL BIL      AROM   Right Knee Extension 0    Right Knee Flexion 120    Left Knee Extension 5    Left Knee Flexion 122      PROM   Right Knee Extension 120    Right Knee Flexion 0    Left Knee Extension 5 pain    Left Knee Flexion 122      Strength   Right Hip Flexion 4+/5    Right Hip Extension 4+/5    Right Hip ABduction 4+/5    Left Hip Flexion 4+/5    Left Hip Extension 4+/5    Left Hip ABduction 4+/5    Right Knee Flexion 5/5    Right Knee Extension 5/5    Left Knee Flexion 5/5    Left Knee Extension 5/5      Flexibility   Soft Tissue Assessment /Muscle Length yes    Quadriceps (+) Ely's BIL, moderately restricted      Palpation   Patella mobility WNL BIL    Palpation comment Severely tender to palpation at L medial joint line, also TTP to R medial joint line   AP/PA tib/fem joint mobs WNL BIL     Special Tests    Special Tests Knee Special Tests;Meniscus Tests    Knee Special tests  Lateral Pull Sign;Patellofemoral Apprehension Test;Patellofemoral Grind Test (Clarke's Sign)    Meniscus Tests Apley's Test;McMurray Test;other      Lateral Pull Sign    Findings Positive    Side Left      Patellofemoral Apprehension Test    Findings Positive    Side  Left      Patellofemoral Grind test (Clark's Sign)   Findings Postive    Side  Left      McMurray Test   Findings Positive    Side Left    Comments Pain      Apley's Test   Findings Positive    Side Right;Left    Comments pain on L, audible pop on R      other   Findings Positive    Side Left    Comments Thessaly at 0/ 20 degrees knee flexion   Severe pain and buckling     Transfers   Five time sit to stand comments  27 seconds  Objective measurements completed on examination: See above findings.                PT Education - 03/09/21 1330     Education  Details Pt educated on potential underlying pathophysiology behind her pain presentation, as well as PT POC if MRI results indicate she is a good candidate for PT.    Person(s) Educated Patient    Methods Explanation    Comprehension Verbalized understanding              PT Short Term Goals - 03/09/21 1347       PT SHORT TERM GOAL #1   Title Update goals if/ when pt is appropriate for PT following L knee MRI                       Plan - 03/09/21 1337     Clinical Impression Statement Pt reports primary c/o chronic L knee pain, associated with popping, locking, and buckling of insidious onset lasting about 1 year. Upon assessment, her primary impairments include painful squating with audible click, severe TTP to L medial joint line, painful and limited end range L knee extension with overpressure, painful and limited sit-to-stand transfer, and mild BIL global hip weakness. In addition, the pt has positive special testing for meniscal pathology on the L, inlcluding positive McMurray's, Apley's, and Thessaly's testing. Of note, the pt had severe pain and buckling when performing Thessaly's at 20 degrees knee flexion on the L. Ruling up L knee meniscal tear. Cannot rule out concordant patellofemoral pain syndrome on L due to positive special testing. Due to severity of pt's pain and sxs, recommend MRI of L knee to rule out meniscal pathology. If imaging indicates mild or no meniscal pathology, pt is advised to follow-up with PT to schedule treatment appointments. If pt is deemed appropriate for PT, she will benefit from skilled PT to address her primary impairments and return to her prior level of function without limitation.    Personal Factors and Comorbidities Time since onset of injury/illness/exacerbation    Examination-Activity Limitations Toileting;Stand;Stairs;Squat;Carry;Sit;Bend;Bathing;Transfers;Locomotion Level    Examination-Participation Restrictions  Occupation;Cleaning;Meal Prep;Community Activity;Shop;Interpersonal Relationship;Aaron Mose    Stability/Clinical Decision Making Evolving/Moderate complexity    Clinical Decision Making Moderate    Rehab Potential Fair    PT Frequency 1x / week    PT Duration 8 weeks    PT Treatment/Interventions ADLs/Self Care Home Management;Aquatic Therapy;Cryotherapy;Moist Heat;Gait training;Stair training;Functional mobility training;Therapeutic activities;Therapeutic exercise;Balance training;Neuromuscular re-education;Manual techniques;Passive range of motion;Patient/family education;Taping;Vasopneumatic Device    PT Next Visit Plan Progress quadriceps reinforcement, hip strengthening if MRI indicates pt is appropriate for PT; Update goals if/ when pt is appropriate for PT following L knee MRI    PT Home Exercise Plan Provide at first appointment    Recommended Other Services L knee MRI to rule out meniscal pathology    Consulted and Agree with Plan of Care Patient             Patient will benefit from skilled therapeutic intervention in order to improve the following deficits and impairments:  Abnormal gait, Decreased range of motion, Difficulty walking, Obesity, Increased muscle spasms, Pain, Impaired flexibility, Decreased balance, Decreased strength, Decreased mobility  Visit Diagnosis: Chronic pain of left knee  Stiffness of left knee, not elsewhere classified  Muscle weakness (generalized)  Difficulty in walking, not elsewhere classified     Problem List Patient Active Problem List   Diagnosis Date Noted   Bipolar affective  disorder, current episode manic with psychotic symptoms (HCC) 10/23/2018   Schizoaffective disorder (HCC) 10/23/2018   Schizoaffective disorder, bipolar type (HCC)    Acquired hypothyroidism 07/10/2018   Persistent migraine aura without cerebral infarction and without status migrainosus, not intractable 07/10/2018   Morbid obesity (HCC) 07/10/2018    Tobacco dependence 07/10/2018    Carmelina Dane, PT, DPT 03/09/21 1:50 PM   [facility] [address] [place], [place], [address] Phone: [phone]   Fax:  [phone]  Name: Jasmane Brockway MRN: [id number] Date of Birth: 1981-02-24

## 2021-03-13 ENCOUNTER — Ambulatory Visit: Payer: Medicaid Other | Admitting: Physician Assistant

## 2021-03-18 ENCOUNTER — Other Ambulatory Visit: Payer: Self-pay

## 2021-03-18 ENCOUNTER — Encounter: Payer: Self-pay | Admitting: Family

## 2021-03-18 ENCOUNTER — Ambulatory Visit (INDEPENDENT_AMBULATORY_CARE_PROVIDER_SITE_OTHER): Payer: Medicaid Other | Admitting: Family

## 2021-03-18 DIAGNOSIS — G8929 Other chronic pain: Secondary | ICD-10-CM

## 2021-03-18 DIAGNOSIS — M25562 Pain in left knee: Secondary | ICD-10-CM | POA: Diagnosis not present

## 2021-03-18 NOTE — Progress Notes (Signed)
Office Visit Note   Patient: Hannah Armstrong           Date of Birth: 1980/12/03           MRN: [id number] Visit Date: 03/18/2021              Requested by: Carmel Sacramento, NP [address] [place],  [place] [address] PCP: Carmel Sacramento, NP  No chief complaint on file.     HPI: The patient is a 40 year old woman who presents today in follow-up.  Continues to have issues with left knee pain and swelling.  This is associated with mechanical symptoms.  She has been in physical therapy without improvement.  States physical therapy strongly recommended MRI of the left knee.  Cannot recall any associated injury other than overuse especially in her job duties.  Had immediate onset of excruciating pain about a month ago.  She did have an associated emergency department visit.  She did have some relief with some Toradol.  She states she takes meloxicam daily without relief she has been taking meloxicam for about a year now  Continues to prefer deferring Depo-Medrol injection of the knee  Assessment & Plan: Visit Diagnoses: No diagnosis found.  Plan: We will proceed with MRI of the left knee.  Follow-Up Instructions: Return mri review duda.   Right Knee Exam   Muscle Strength  The patient has normal right knee strength.  Tenderness  The patient is experiencing tenderness in the medial joint line.  Range of Motion  The patient has normal right knee ROM.  Tests  Varus: negative Valgus: negative Drawer:  Anterior - negative    Posterior - negative  Other  Erythema: absent Swelling: moderate     Patient is alert, oriented, no adenopathy, well-dressed, normal affect, normal respiratory effort.  Imaging: No results found. No images are attached to the encounter.  Labs: Lab Results  Component Value Date   HGBA1C 5.6 07/10/2018     Lab Results  Component Value Date   ALBUMIN 4.9 10/23/2018   ALBUMIN 4.4 07/10/2018    No results found for: MG No  results found for: VD25OH  No results found for: PREALBUMIN CBC EXTENDED Latest Ref Rng & Units 09/26/2019 10/23/2018 07/10/2018  WBC 4.0 - 10.5 K/uL 12.8(H) 13.0(H) 8.1  RBC 3.87 - 5.11 MIL/uL 4.13 4.07 4.12  HGB 12.0 - 15.0 g/dL 01.6 01.0 93.2  HCT 35.5 - 46.0 % 38.9 38.4 37.3  PLT 150 - 400 K/uL 322 288 253     There is no height or weight on file to calculate BMI.  Orders:  No orders of the defined types were placed in this encounter.  No orders of the defined types were placed in this encounter.    Procedures: No procedures performed  Clinical Data: No additional findings.  ROS:  All other systems negative, except as noted in the HPI. Review of Systems  Constitutional:  Negative for chills and fever.  Cardiovascular:  Negative for leg swelling.  Musculoskeletal:  Positive for arthralgias, joint swelling and myalgias.   Objective: Vital Signs: There were no vitals taken for this visit.  Specialty Comments:  No specialty comments available.  PMFS History: Patient Active Problem List   Diagnosis Date Noted   Bipolar affective disorder, current episode manic with psychotic symptoms (HCC) 10/23/2018   Schizoaffective disorder (HCC) 10/23/2018   Schizoaffective disorder, bipolar type (HCC)    Acquired hypothyroidism 07/10/2018   Persistent migraine aura without cerebral infarction and without status  migrainosus, not intractable 07/10/2018   Morbid obesity (HCC) 07/10/2018   Tobacco dependence 07/10/2018   History reviewed. No pertinent past medical history.  Family History  Problem Relation Age of Onset   Hypertension Mother    Breast cancer Sister    Diabetes Maternal Aunt    Sickle cell anemia Paternal Aunt    Breast cancer Paternal Aunt     Past Surgical History:  Procedure Laterality Date   ANTERIOR CRUCIATE LIGAMENT (ACL) REVISION Right 2016 Nov   Social History   Occupational History   Not on file  Tobacco Use   Smoking status: Some Days     Packs/day: 0.25    Years: 16.00    Pack years: 4.00    Types: Cigarettes   Smokeless tobacco: Never  Vaping Use   Vaping Use: Never used  Substance and Sexual Activity   Alcohol use: Not Currently   Drug use: Yes    Comment: CBD - hump   Sexual activity: Not on file

## 2021-04-06 ENCOUNTER — Ambulatory Visit
Admission: RE | Admit: 2021-04-06 | Discharge: 2021-04-06 | Disposition: A | Discharge status: Home / Self Care | Payer: Medicaid Other | Source: Ambulatory Visit | Attending: Family | Admitting: Family

## 2021-04-06 ENCOUNTER — Other Ambulatory Visit: Payer: Self-pay

## 2021-04-06 DIAGNOSIS — G8929 Other chronic pain: Secondary | ICD-10-CM

## 2021-04-06 DIAGNOSIS — M25562 Pain in left knee: Secondary | ICD-10-CM

## 2021-04-09 ENCOUNTER — Other Ambulatory Visit: Payer: Self-pay

## 2021-04-09 ENCOUNTER — Ambulatory Visit (INDEPENDENT_AMBULATORY_CARE_PROVIDER_SITE_OTHER): Payer: Medicaid Other | Admitting: Orthopedic Surgery

## 2021-04-09 DIAGNOSIS — M25562 Pain in left knee: Secondary | ICD-10-CM

## 2021-04-09 DIAGNOSIS — G8929 Other chronic pain: Secondary | ICD-10-CM

## 2021-04-14 ENCOUNTER — Other Ambulatory Visit: Payer: Self-pay | Admitting: Orthopedic Surgery

## 2021-04-14 ENCOUNTER — Telehealth: Payer: Self-pay | Admitting: Orthopedic Surgery

## 2021-04-14 MED ORDER — CYCLOBENZAPRINE HCL 10 MG PO TABS: 10.0000 mg | ORAL_TABLET | Freq: Two times a day (BID) | ORAL | 0 refills | Status: DC | PRN

## 2021-04-14 NOTE — Telephone Encounter (Signed)
This pt was eval in office 04/09/21 MRI review dictation pending please see message below and advise.

## 2021-04-14 NOTE — Telephone Encounter (Signed)
Patient called asked if she can get Flexeril prescribed for her until she get the injection. Patient uses [facility]  [address] [phone]   Ph# [phone]   The number to contact patient is [phone]

## 2021-04-15 NOTE — Telephone Encounter (Signed)
Called pt and m on vm to advise that this has been done to call with any questions.

## 2021-04-20 ENCOUNTER — Encounter: Payer: Self-pay | Admitting: Orthopedic Surgery

## 2021-04-20 NOTE — Progress Notes (Signed)
Office Visit Note   Patient: Hannah Armstrong           Date of Birth: 03-27-1981           MRN: [id number] Visit Date: 04/09/2021              Requested by: Carmel Sacramento, NP [address] [place],  [place] [address] PCP: Carmel Sacramento, NP  Chief Complaint  Patient presents with   Left Knee - Follow-up    MRI review  Images on computer      HPI: Patient is a 40 year old woman who presents in follow-up for an MRI scan of her left knee.  Patient has had previous ACL reconstruction on the right knee.  Patient complains of pain on uneven terrain.  Assessment & Plan: Visit Diagnoses:  1. Chronic pain of left knee     Plan: MRI scan does not show any meniscal or ligamentous injury.  Recommended physical therapy versus steroid injection.  Patient states she will call to schedule a steroid injection.  Follow-Up Instructions: Return if symptoms worsen or fail to improve.   Ortho Exam  Patient is alert, oriented, no adenopathy, well-dressed, normal affect, normal respiratory effort. Examination there is no crepitation with range of motion of the left knee.  There is no effusion the medial lateral joint line are nontender to palpation she does have some tenderness palpation the patellofemoral joint.  Review of the MRI scan shows intact ligamentous structures with a small joint effusion with intact meniscus.  There is no focal marrow edema.  Imaging: No results found. No images are attached to the encounter.  Labs: Lab Results  Component Value Date   HGBA1C 5.6 07/10/2018     Lab Results  Component Value Date   ALBUMIN 4.9 10/23/2018   ALBUMIN 4.4 07/10/2018    No results found for: MG No results found for: VD25OH  No results found for: PREALBUMIN CBC EXTENDED Latest Ref Rng & Units 09/26/2019 10/23/2018 07/10/2018  WBC 4.0 - 10.5 K/uL 12.8(H) 13.0(H) 8.1  RBC 3.87 - 5.11 MIL/uL 4.13 4.07 4.12  HGB 12.0 - 15.0 g/dL 01.0 27.2 53.6  HCT 64.4 - 46.0 % 38.9  38.4 37.3  PLT 150 - 400 K/uL 322 288 253     There is no height or weight on file to calculate BMI.  Orders:  No orders of the defined types were placed in this encounter.  No orders of the defined types were placed in this encounter.    Procedures: No procedures performed  Clinical Data: No additional findings.  ROS:  All other systems negative, except as noted in the HPI. Review of Systems  Objective: Vital Signs: There were no vitals taken for this visit.  Specialty Comments:  No specialty comments available.  PMFS History: Patient Active Problem List   Diagnosis Date Noted   Bipolar affective disorder, current episode manic with psychotic symptoms (HCC) 10/23/2018   Schizoaffective disorder (HCC) 10/23/2018   Schizoaffective disorder, bipolar type (HCC)    Acquired hypothyroidism 07/10/2018   Persistent migraine aura without cerebral infarction and without status migrainosus, not intractable 07/10/2018   Morbid obesity (HCC) 07/10/2018   Tobacco dependence 07/10/2018   History reviewed. No pertinent past medical history.  Family History  Problem Relation Age of Onset   Hypertension Mother    Breast cancer Sister    Diabetes Maternal Aunt    Sickle cell anemia Paternal Aunt    Breast cancer Paternal Aunt     Past  Surgical History:  Procedure Laterality Date   ANTERIOR CRUCIATE LIGAMENT (ACL) REVISION Right 2016 Nov   Social History   Occupational History   Not on file  Tobacco Use   Smoking status: Some Days    Packs/day: 0.25    Years: 16.00    Pack years: 4.00    Types: Cigarettes   Smokeless tobacco: Never  Vaping Use   Vaping Use: Never used  Substance and Sexual Activity   Alcohol use: Not Currently   Drug use: Yes    Comment: CBD - hump   Sexual activity: Not on file

## 2021-04-23 ENCOUNTER — Ambulatory Visit: Payer: Medicaid Other | Admitting: Orthopedic Surgery

## 2022-10-05 IMAGING — MR MR KNEE*L* W/O CM
4 of 7 series · 22 of 40 positions shown · non-contrast
Comparison: X-ray knee 02/08/2021.

CLINICAL DATA: Patient complains of generalized left knee pain,
swelling and stiffness. Patient reports difficulty with weight
bearing and flexion for 7 months. No known injury. Patient denies
history of therapeutic injections or surgeries.

EXAM:
MRI OF THE LEFT KNEE WITHOUT CONTRAST
TECHNIQUE: Multiplanar, multisequence MR imaging of the knee was performed. No
intravenous contrast was administered.

[Series 3: T2 fat-sat · axial · 4.0mm · 0.50mm/px · z∈[-90,+35]mm · 5 of 26 slices shown]
[im 1/26]
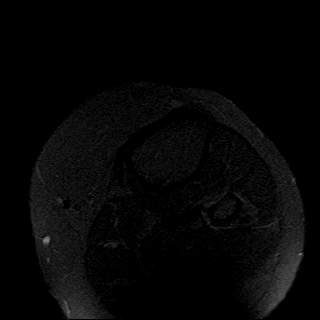
[im 7/26]
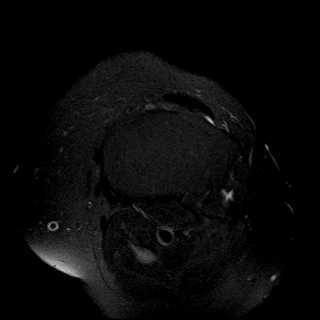
[im 13/26]
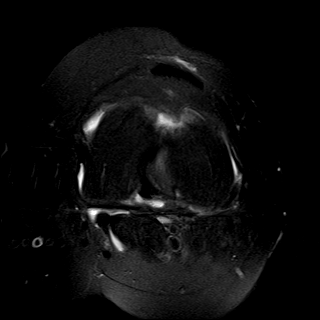
[im 19/26]
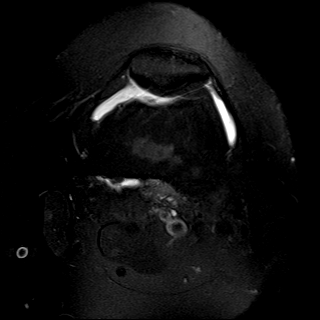
[im 26/26]
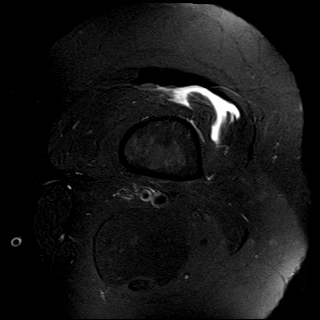

[Series 7: PD fat-sat · sagittal · 3.0mm · 0.29mm/px · 7 of 27 slices shown (1 of 3)]
[im 1/27]
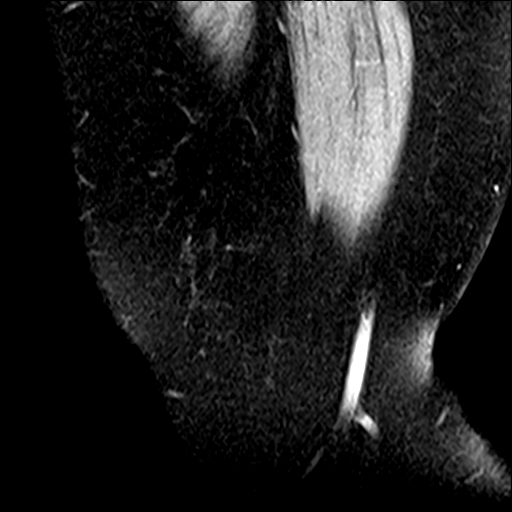
[im 5/27]
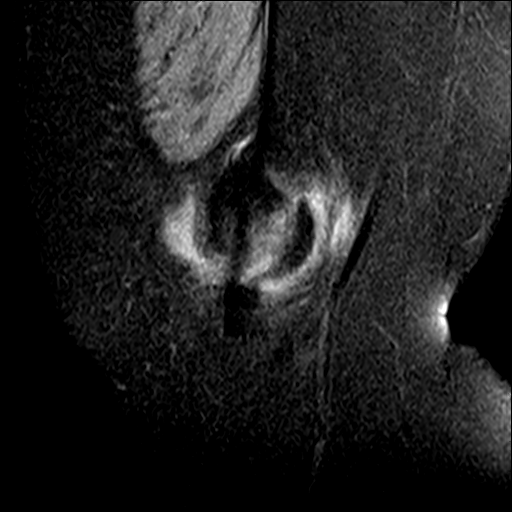
[im 9/27]
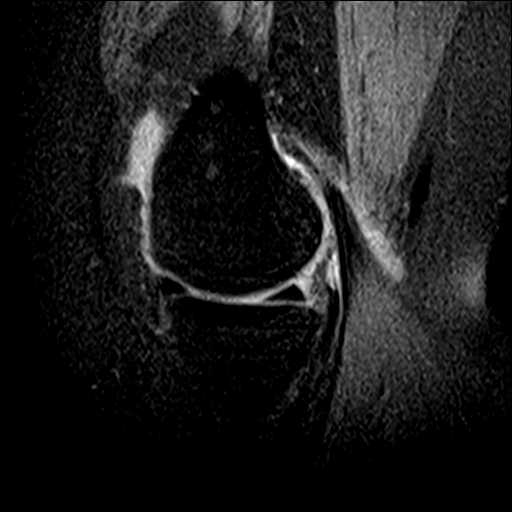
[im 14/27]
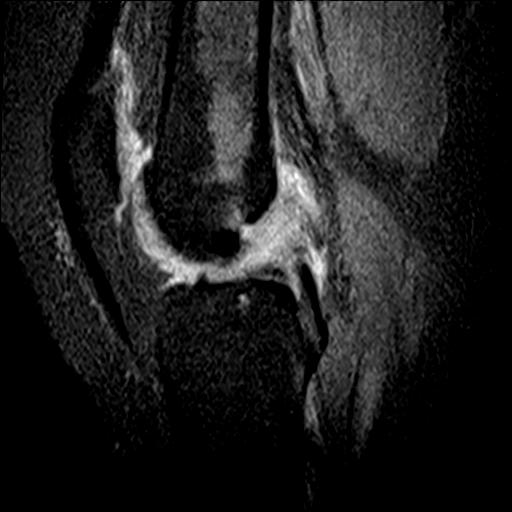
[im 18/27]
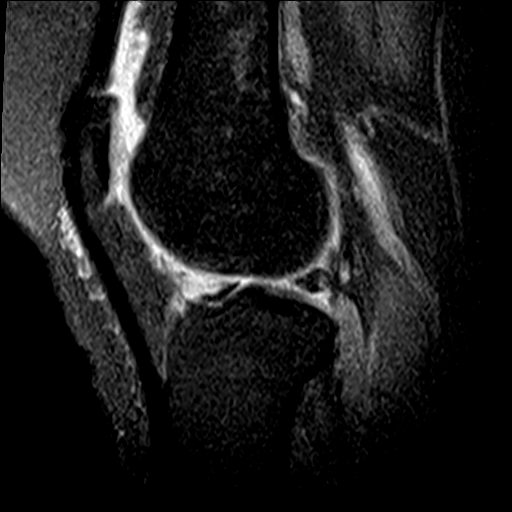
[im 22/27]
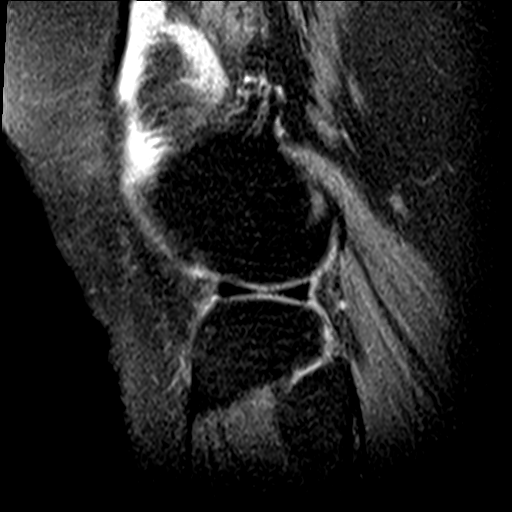
[im 27/27]
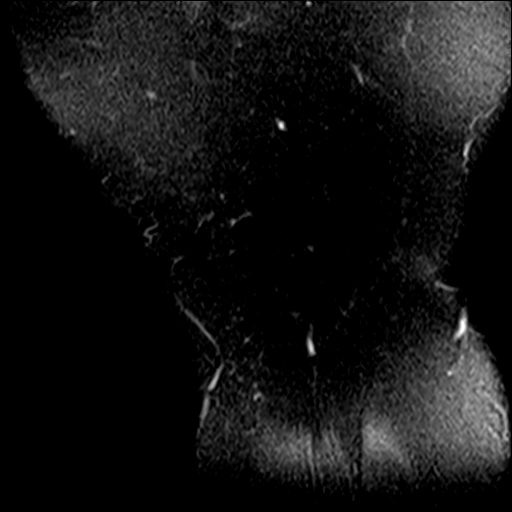

[Series 8: PD fat-sat · coronal · 3.0mm · 0.29mm/px · 7 of 29 slices shown (2 of 3)]
[im 1/29]
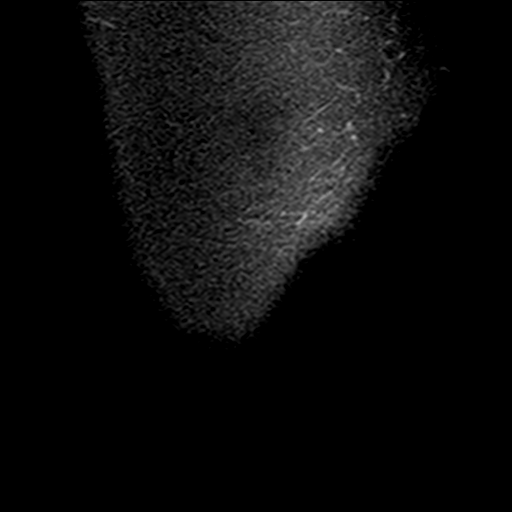
[im 5/29]
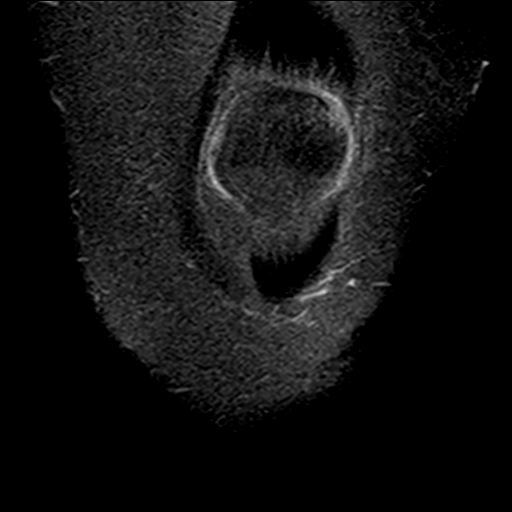
[im 10/29]
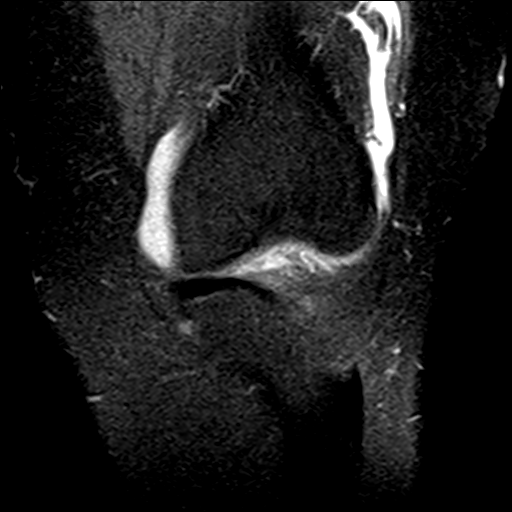
[im 15/29]
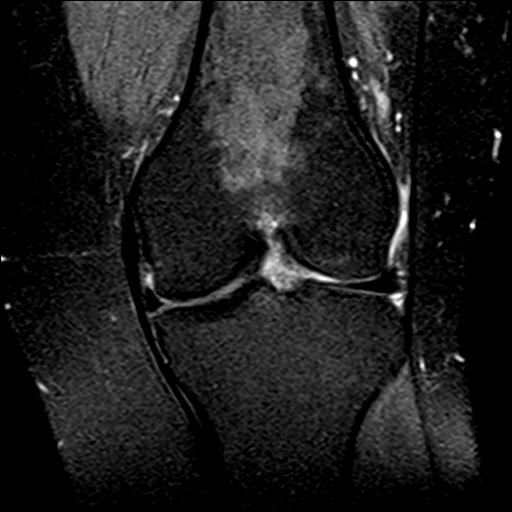
[im 19/29]
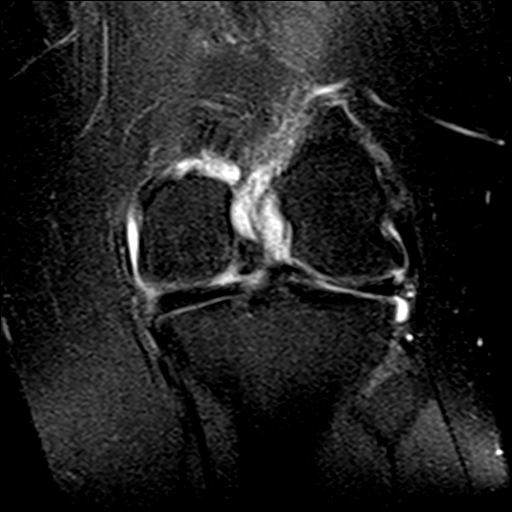
[im 24/29]
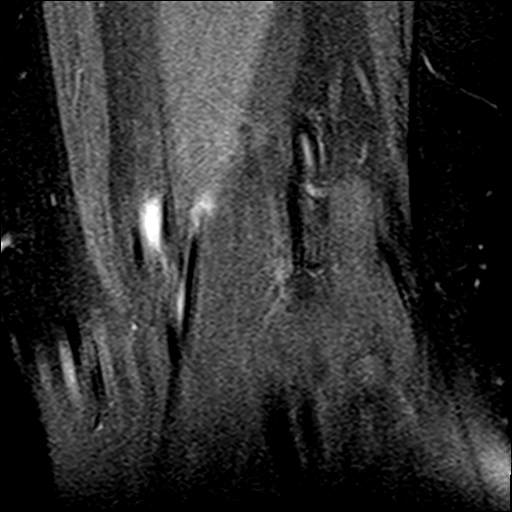
[im 29/29]
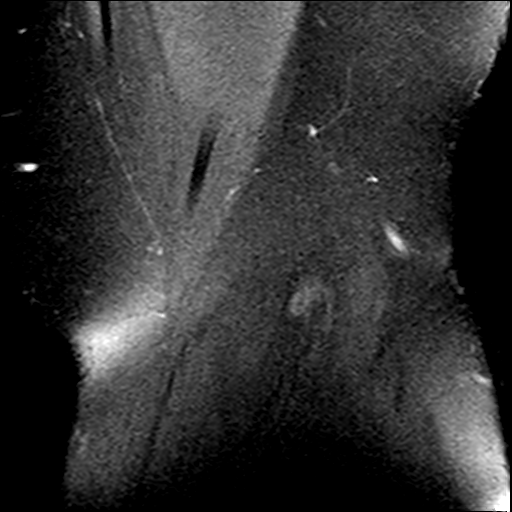

[Series 9: PD fat-sat · oblique · 2.0mm · 0.29mm/px · 3 of 13 slices shown (3 of 3)]
[im 1/13]
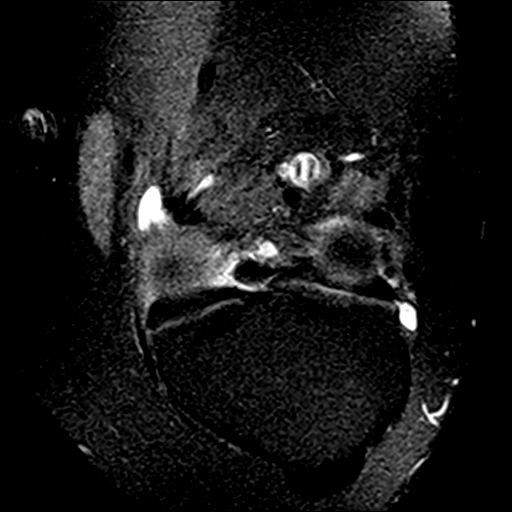
[im 7/13]
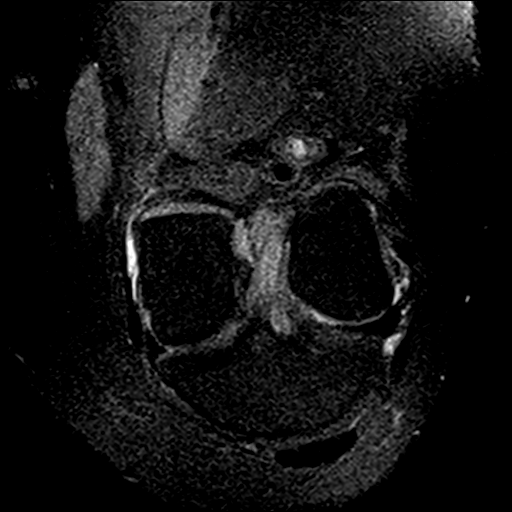
[im 13/13]
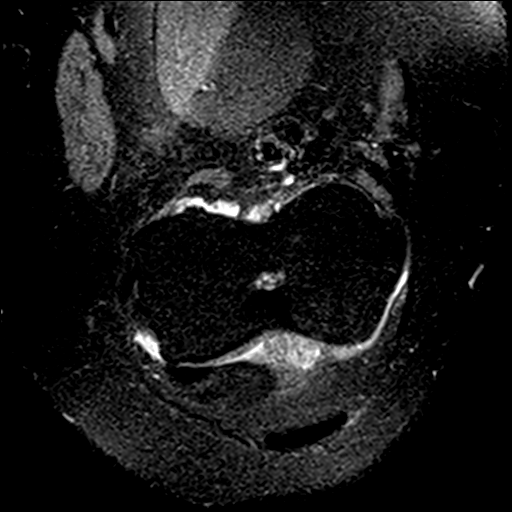

[22 of 40 positions shown; findings below may reference images not displayed]

FINDINGS: Technical Note: Despite efforts by the technologist and patient,
motion artifact is present on today's exam and could not be
eliminated. This reduces exam sensitivity and specificity.

MENISCI

Medial meniscus:  Intact.

Lateral meniscus: Intact.

LIGAMENTS

Cruciates: Intact ACL with prominent mucoid degeneration. Intact
PCL.

Collaterals: Medial collateral ligament is intact. Lateral
collateral ligament complex is intact.

CARTILAGE

Patellofemoral: No chondral defect.

Medial: No chondral defect.

Lateral: No chondral defect.

Joint:  Small joint effusion.  Fat pads within normal limits.

Popliteal Fossa:  Small Baker's cyst.  Intact popliteus tendon.

Extensor Mechanism: Intact quadriceps tendon and patellofemoral
tendon.

Bones: No focal marow signal abnormality. No fracture or
dislocation.

Other: None.
IMPRESSION: 1. Motion degraded exam.
2. Intact ACL with prominent mucoid degeneration.
3. Small joint effusion and small Baker's cyst.
4. Intact menisci.

## 2022-10-21 ENCOUNTER — Ambulatory Visit
Admission: EM | Admit: 2022-10-21 | Discharge: 2022-10-21 | Disposition: A | Discharge status: Home / Self Care | Payer: Medicaid Other | Attending: Family Medicine | Admitting: Family Medicine

## 2022-10-21 DIAGNOSIS — L700 Acne vulgaris: Secondary | ICD-10-CM | POA: Diagnosis not present

## 2022-10-21 MED ORDER — DOXYCYCLINE HYCLATE 100 MG PO CAPS: 100.0000 mg | ORAL_CAPSULE | Freq: Two times a day (BID) | ORAL | 0 refills | Status: AC

## 2022-10-21 NOTE — ED Triage Notes (Signed)
Pt states she needs a refill on her doxycyline for her acne.

## 2022-10-21 NOTE — ED Provider Notes (Signed)
[name]    CSN: [id number] Arrival date & time: 10/21/22  1500      History   Chief Complaint Chief Complaint  Patient presents with   Medication Refill    HPI Hannah Armstrong is a 42 y.o. female.   HPI Patient history of chronic acne vulgaris presents today for medication refill of Doxycyline. Patient recently relocated back to [facility] from [place] and was under the care of her primary care  provider there who treated her with Doxycyline and topical ointments for management of her chronic acne. Would like information to get established with a dermatologist for ongoing management. History reviewed. No pertinent past medical history.  Patient Active Problem List   Diagnosis Date Noted   Bipolar affective disorder, current episode manic with psychotic symptoms (HCC) 10/23/2018   Schizoaffective disorder (HCC) 10/23/2018   Schizoaffective disorder, bipolar type (HCC)    Acquired hypothyroidism 07/10/2018   Persistent migraine aura without cerebral infarction and without status migrainosus, not intractable 07/10/2018   Morbid obesity (HCC) 07/10/2018   Tobacco dependence 07/10/2018    Past Surgical History:  Procedure Laterality Date   ANTERIOR CRUCIATE LIGAMENT (ACL) REVISION Right 2016 Nov    OB History   No obstetric history on file.      Home Medications    Prior to Admission medications   Medication Sig Start Date End Date Taking? Authorizing Provider  doxycycline (VIBRAMYCIN) 100 MG capsule Take 1 capsule (100 mg total) by mouth 2 (two) times daily for 14 days. 10/21/22 11/04/22 Yes Bing Neighbors, NP  ARIPiprazole ER (ABILIFY MAINTENA) 400 MG SRER injection Inject 2 mLs (400 mg total) into the muscle every 28 (twenty-eight) days. Due 6/15 Patient not taking: No sig reported 10/27/18   Malvin Johns, MD  benztropine (COGENTIN) 0.5 MG tablet Take 1 tablet (0.5 mg total) by mouth 2 (two) times daily. Patient not taking: No sig reported  10/27/18   Malvin Johns, MD  cyclobenzaprine (FLEXERIL) 10 MG tablet Take 1 tablet (10 mg total) by mouth 2 (two) times daily as needed for muscle spasms. 04/14/21   Nadara Mustard, MD  HYDROcodone-acetaminophen (NORCO/VICODIN) 5-325 MG tablet Take 1-2 tablets by mouth every 6 (six) hours as needed. 02/08/21   Prosperi, Christian H, PA-C  levothyroxine (SYNTHROID) 50 MCG tablet Take 50 mcg by mouth daily before breakfast.    [provider]  levothyroxine (SYNTHROID, LEVOTHROID) 75 MCG tablet Take 1 tablet (75 mcg total) by mouth daily before breakfast. Patient not taking: No sig reported 07/10/18   Marcine Matar, MD  meloxicam (MOBIC) 15 MG tablet Take 1 tablet (15 mg total) by mouth daily. 02/18/21   Persons, [place], PA  propranolol (INDERAL) 10 MG tablet Take 1 tablet (10 mg total) by mouth 2 (two) times daily. Patient not taking: No sig reported 10/27/18   Malvin Johns, MD  risperiDONE (RISPERDAL) 3 MG tablet Take 1 tablet (3 mg total) by mouth 3 (three) times daily. Patient not taking: No sig reported 10/27/18   Malvin Johns, MD  temazepam (RESTORIL) 30 MG capsule Take 1 capsule (30 mg total) by mouth at bedtime. Patient not taking: No sig reported 10/27/18   Malvin Johns, MD  topiramate (TOPAMAX) 25 MG tablet Take 1 tablet (25 mg total) by mouth at bedtime. Patient not taking: No sig reported 07/10/18   Marcine Matar, MD    Family History Family History  Problem Relation Age of Onset   Hypertension Mother    Breast  cancer Sister    Diabetes Maternal Aunt    Sickle cell anemia Paternal Aunt    Breast cancer Paternal Aunt     Social History Social History   Tobacco Use   Smoking status: Some Days    Packs/day: 0.25    Years: 16.00    Additional pack years: 0.00    Total pack years: 4.00    Types: Cigarettes   Smokeless tobacco: Never  Vaping Use   Vaping Use: Never used  Substance Use Topics   Alcohol use: Not Currently   Drug use: Yes    Comment: CBD - hump      Allergies   Patient has no known allergies.   Review of Systems Review of Systems Pertinent negatives listed in HPI  Physical Exam Triage Vital Signs ED Triage Vitals [10/21/22 1518]  Enc Vitals Group     BP 119/80     Pulse Rate 84     Resp 16     Temp 98.5 F (36.9 C)     Temp Source Oral     SpO2 96 %     Weight      Height      Head Circumference      Peak Flow      Pain Score 0     Pain Loc      Pain Edu?      Excl. in GC?    No data found.  Updated Vital Signs BP 119/80 (BP Location: Right Arm)   Pulse 84   Temp 98.5 F (36.9 C) (Oral)   Resp 16   LMP 09/20/2022 (Approximate)   SpO2 96%   Visual Acuity Right Eye Distance:   Left Eye Distance:   Bilateral Distance:    Right Eye Near:   Left Eye Near:    Bilateral Near:     Physical Exam Constitutional:      Appearance: Normal appearance.  HENT:     Head: Normocephalic and atraumatic.  Eyes:     Pupils: Pupils are equal, round, and reactive to light.  Cardiovascular:     Rate and Rhythm: Normal rate and regular rhythm.  Pulmonary:     Effort: Pulmonary effort is normal.     Breath sounds: Normal breath sounds.  Skin:    General: Skin is dry.     Findings: Acne present.     Comments: Generalized cystic nodular acne to the left side of face and chest wall  Neurological:     General: No focal deficit present.     Mental Status: She is alert.    UC Treatments / Results  Labs (all labs ordered are listed, but only abnormal results are displayed) Labs Reviewed - No data to display  EKG   Radiology No results found.  Procedures Procedures (including critical care time)  Medications Ordered in UC Medications - No data to display  Initial Impression / Assessment and Plan / UC Course  I have reviewed the triage vital signs and the nursing notes.  Pertinent labs & imaging results that were available during my care of the patient were reviewed by me and considered in my medical  decision making (see chart for details).    Cystic Acne Vulgaris Prescribed with Doxycyline BID x 14 days, as a courtesy refill Information provided to establish with [facility]. Patient verbalized understanding and agreement with plan.  Final Clinical Impressions(s) / UC Diagnoses   Final diagnoses:  Cystic acne vulgaris  Discharge Instructions      Contact [facility] to schedule establish care with a dermatology provider.     ED Prescriptions     Medication Sig Dispense Auth. Provider   doxycycline (VIBRAMYCIN) 100 MG capsule Take 1 capsule (100 mg total) by mouth 2 (two) times daily for 14 days. 28 capsule Bing Neighbors, NP      PDMP not reviewed this encounter.   Bing Neighbors, NP 10/21/22 [phone]

## 2022-10-21 NOTE — Discharge Instructions (Signed)
Contact [facility] to schedule establish care with a dermatology provider.

## 2023-01-14 ENCOUNTER — Ambulatory Visit: Payer: Medicaid Other | Admitting: Nurse Practitioner

## 2023-10-16 ENCOUNTER — Emergency Department (HOSPITAL_COMMUNITY)
Admission: EM | Admit: 2023-10-16 | Discharge: 2023-10-18 | Disposition: A | Discharge status: Other Acute Inpatient Hospital | Payer: MEDICAID | Attending: Emergency Medicine | Admitting: Emergency Medicine

## 2023-10-16 ENCOUNTER — Encounter (HOSPITAL_COMMUNITY): Payer: Self-pay

## 2023-10-16 DIAGNOSIS — F250 Schizoaffective disorder, bipolar type: Secondary | ICD-10-CM | POA: Diagnosis present

## 2023-10-16 LAB — ACETAMINOPHEN LEVEL: Acetaminophen (Tylenol), Serum: <10 ug/mL — ABNORMAL LOW (ref 10–30)

## 2023-10-16 LAB — CBC WITH DIFFERENTIAL/PLATELET
Abs Immature Granulocytes: 0.07 10*3/uL (ref 0.00–0.07)
Basophils Absolute: 0.1 10*3/uL (ref 0.0–0.1)
Basophils Relative: 0 %
Eosinophils Absolute: 0 10*3/uL (ref 0.0–0.5)
Eosinophils Relative: 0 %
HCT: 38.2 % (ref 36.0–46.0)
Hemoglobin: 12.7 g/dL (ref 12.0–15.0)
Immature Granulocytes: 1 %
Lymphocytes Relative: 14 %
Lymphs Abs: 2.1 10*3/uL (ref 0.7–4.0)
MCH: 30.5 pg (ref 26.0–34.0)
MCHC: 33.2 g/dL (ref 30.0–36.0)
MCV: 91.8 fL (ref 80.0–100.0)
Monocytes Absolute: 0.6 10*3/uL (ref 0.1–1.0)
Monocytes Relative: 4 %
Neutro Abs: 12.6 10*3/uL — ABNORMAL HIGH (ref 1.7–7.7)
Neutrophils Relative %: 81 %
Platelets: 261 10*3/uL (ref 150–400)
RBC: 4.16 MIL/uL (ref 3.87–5.11)
RDW: 14.3 % (ref 11.5–15.5)
WBC: 15.4 10*3/uL — ABNORMAL HIGH (ref 4.0–10.5)
nRBC: 0 % (ref 0.0–0.2)

## 2023-10-16 LAB — COMPREHENSIVE METABOLIC PANEL WITH GFR
ALT: 25 U/L (ref 0–44)
AST: 31 U/L (ref 15–41)
Albumin: 4.4 g/dL (ref 3.5–5.0)
Alkaline Phosphatase: 57 U/L (ref 38–126)
Anion gap: 9 (ref 5–15)
BUN: 7 mg/dL (ref 6–20)
CO2: 23 mmol/L (ref 22–32)
Calcium: 9.4 mg/dL (ref 8.9–10.3)
Chloride: 104 mmol/L (ref 98–111)
Creatinine, Ser: 1.03 mg/dL — ABNORMAL HIGH (ref 0.44–1.00)
GFR, Estimated: >60 mL/min (ref >60)
Glucose, Bld: 110 mg/dL — ABNORMAL HIGH (ref 70–99)
Potassium: 4.1 mmol/L (ref 3.5–5.1)
Sodium: 136 mmol/L (ref 135–145)
Total Bilirubin: 1.2 mg/dL (ref 0.0–1.2)
Total Protein: 7.7 g/dL (ref 6.5–8.1)

## 2023-10-16 LAB — RAPID URINE DRUG SCREEN, HOSP PERFORMED
Amphetamines: NOT DETECTED
Barbiturates: NOT DETECTED
Benzodiazepines: NOT DETECTED
Cocaine: NOT DETECTED
Opiates: NOT DETECTED
Tetrahydrocannabinol: POSITIVE — AB

## 2023-10-16 LAB — PREGNANCY, URINE: Preg Test, Ur: NEGATIVE

## 2023-10-16 LAB — SALICYLATE LEVEL: Salicylate Lvl: <7 mg/dL — ABNORMAL LOW (ref 7.0–30.0)

## 2023-10-16 LAB — ETHANOL: Alcohol, Ethyl (B): <15 mg/dL (ref <15)

## 2023-10-16 MED ORDER — HALOPERIDOL LACTATE 5 MG/ML IJ SOLN
10.0000 mg | Freq: Once | INTRAMUSCULAR | Status: AC | PRN
  Administered 2023-10-16: 10 mg via INTRAMUSCULAR
  Filled 2023-10-16: qty 2

## 2023-10-16 MED ORDER — ACETAMINOPHEN 500 MG PO TABS
1000.0000 mg | ORAL_TABLET | Freq: Once | ORAL | Status: AC
  Administered 2023-10-16: 1000 mg via ORAL
  Filled 2023-10-16: qty 2

## 2023-10-16 MED ORDER — DIPHENHYDRAMINE HCL 50 MG/ML IJ SOLN
25.0000 mg | Freq: Once | INTRAMUSCULAR | Status: AC
Start: 2023-10-16 — End: 2023-10-16
  Administered 2023-10-16: 25 mg via INTRAMUSCULAR
  Filled 2023-10-16: qty 1

## 2023-10-16 MED ORDER — LORAZEPAM 2 MG/ML IJ SOLN
2.0000 mg | Freq: Once | INTRAMUSCULAR | Status: AC | PRN
  Administered 2023-10-16: 2 mg via INTRAMUSCULAR
  Filled 2023-10-16: qty 1

## 2023-10-16 NOTE — ED Notes (Signed)
 The patient came in IVC'd escorted by [employer].  IVC document is current as of 10/16/2023 IVC documents expire: 10/23/2023 Three copies of the IVC docs were given to the secretary and uploaded to the patient's chart. Three copies are on the clipboard in the blue zone. A copy was placed in the red file.  IVC documents uploaded to E-file.

## 2023-10-16 NOTE — ED Notes (Signed)
 Pt attempting to get out of stretcher. This RN approaches patient and asks where patient is going. Pt then states, "I want my discharge papers. I've been really patient with you guys." This RN expalined to patient that she would not be able to leave, as she is IVC'd. This RN asked patient to change into paper scrubs. Pt states "Why do you guys keep trying to make me change." Pt notified of our policy for [facility] scrub change. Pt refuses. Pt gets up and attempts to leave the unit. Security called, pt escorted back to stretcher. Pt still refusing to dress out and becoming increasingly agitated. This RN attempts verbal de-escalation and supportive listening, however pt still raising voice. Security, [employer], and charge nurse at bedside.

## 2023-10-16 NOTE — ED Notes (Addendum)
 Patient pant and sandal was placed in locker 4. Patient  one crutch in purple with her name on it.

## 2023-10-16 NOTE — ED Notes (Signed)
 Pt wanded by security.

## 2023-10-16 NOTE — ED Notes (Signed)
 Prosperi, PA completed the first exam.   The first exam has been uploaded to the patient's chart.  Envelope Number: [id number] Patient is being very aggressive with [facility] Security and [employer].
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
## 2023-10-16 NOTE — ED Provider Notes (Signed)
 [place] EMERGENCY DEPARTMENT AT [facility] Provider Note   CSN: [id number] Arrival date & time: 10/16/23  1302     History  No chief complaint on file.   Hannah Armstrong is a 43 y.o. female with past medical history significant for obesity, tobacco dependence, bipolar disorder, schizoaffective disorder who presents via [employer] under IVC.  Her daughter reports patient is planned for schizophrenic and mixing her medicine with medical drugs.  She has been emotionally labile, violent towards family, punching holes in the walls.  Per [employer] patient has been uncooperative and refusing to answer questions.  HPI     Home Medications Prior to Admission medications   Medication Sig Start Date End Date Taking? Authorizing Provider  ARIPiprazole  ER (ABILIFY  MAINTENA) 400 MG SRER injection Inject 2 mLs (400 mg total) into the muscle every 28 (twenty-eight) days. Due 6/15 Patient not taking: No sig reported 10/27/18   Suzi Essex, MD  benztropine  (COGENTIN ) 0.5 MG tablet Take 1 tablet (0.5 mg total) by mouth 2 (two) times daily. Patient not taking: No sig reported 10/27/18   Suzi Essex, MD  cyclobenzaprine  (FLEXERIL ) 10 MG tablet Take 1 tablet (10 mg total) by mouth 2 (two) times daily as needed for muscle spasms. 04/14/21   Timothy Ford, MD  HYDROcodone -acetaminophen  (NORCO/VICODIN) 5-325 MG tablet Take 1-2 tablets by mouth every 6 (six) hours as needed. 02/08/21   Kyler Lerette H, PA-C  levothyroxine  (SYNTHROID ) 50 MCG tablet Take 50 mcg by mouth daily before breakfast.    [provider]  levothyroxine  (SYNTHROID , LEVOTHROID) 75 MCG tablet Take 1 tablet (75 mcg total) by mouth daily before breakfast. Patient not taking: No sig reported 07/10/18   Lawrance Presume, MD  meloxicam  (MOBIC ) 15 MG tablet Take 1 tablet (15 mg total) by mouth daily. 02/18/21   Persons, Norma Beckers, PA  propranolol  (INDERAL ) 10 MG tablet Take 1 tablet (10 mg total) by mouth 2 (two) times  daily. Patient not taking: No sig reported 10/27/18   Suzi Essex, MD  risperiDONE  (RISPERDAL ) 3 MG tablet Take 1 tablet (3 mg total) by mouth 3 (three) times daily. Patient not taking: No sig reported 10/27/18   Suzi Essex, MD  temazepam  (RESTORIL ) 30 MG capsule Take 1 capsule (30 mg total) by mouth at bedtime. Patient not taking: No sig reported 10/27/18   Suzi Essex, MD  topiramate  (TOPAMAX ) 25 MG tablet Take 1 tablet (25 mg total) by mouth at bedtime. Patient not taking: No sig reported 07/10/18   Lawrance Presume, MD      Allergies    Patient has no known allergies.    Review of Systems   Review of Systems  All other systems reviewed and are negative.   Physical Exam Updated Vital Signs BP 118/79 (BP Location: Left Wrist)   Pulse 99   Temp 98.5 F (36.9 C) (Oral)   Resp 18   SpO2 96%  Physical Exam Vitals and nursing note reviewed.  Constitutional:      General: She is not in acute distress.    Appearance: Normal appearance.  HENT:     Head: Normocephalic and atraumatic.  Eyes:     General:        Right eye: No discharge.        Left eye: No discharge.  Cardiovascular:     Rate and Rhythm: Normal rate and regular rhythm.     Heart sounds: No murmur heard.    No friction rub. No gallop.  Pulmonary:     Effort: Pulmonary effort is normal.     Breath sounds: Normal breath sounds.  Abdominal:     General: Bowel sounds are normal.     Palpations: Abdomen is soft.  Skin:    General: Skin is warm and dry.     Capillary Refill: Capillary refill takes less than 2 seconds.  Neurological:     Mental Status: She is alert.  Psychiatric:     Comments: Refuses to cooperate with exam or answer questions, but is alert and obvious she is understanding questioning.  Later in her visit she becomes aggressive, and uncooperative with staff, attempting to fight the police officers, nurses and techs.     ED Results / Procedures / Treatments   Labs (all labs ordered are  listed, but only abnormal results are displayed) Labs Reviewed  COMPREHENSIVE METABOLIC PANEL WITH GFR - Abnormal; Notable for the following components:      Result Value   Glucose, Bld 110 (*)    Creatinine, Ser 1.03 (*)    All other components within normal limits  CBC WITH DIFFERENTIAL/PLATELET - Abnormal; Notable for the following components:   WBC 15.4 (*)    Neutro Abs 12.6 (*)    All other components within normal limits  SALICYLATE LEVEL - Abnormal; Notable for the following components:   Salicylate Lvl <7.0 (*)    All other components within normal limits  ACETAMINOPHEN  LEVEL - Abnormal; Notable for the following components:   Acetaminophen  (Tylenol ), Serum <10 (*)    All other components within normal limits  ETHANOL  RAPID URINE DRUG SCREEN, HOSP PERFORMED  PREGNANCY, URINE    EKG None  Radiology No results found.  Procedures Procedures    Medications Ordered in ED Medications  LORazepam  (ATIVAN ) injection 2 mg (has no administration in time range)  haloperidol  lactate (HALDOL ) injection 10 mg (10 mg Intramuscular Given 10/16/23 1415)  diphenhydrAMINE (BENADRYL) injection 25 mg (25 mg Intramuscular Given 10/16/23 1414)    ED Course/ Medical Decision Making/ A&P Clinical Course as of 10/16/23 1534  Sun Oct 16, 2023  1534 Clear for TTS [CP]    Clinical Course User Index [CP] Nelly Banco, PA-C                                 Medical Decision Making Amount and/or Complexity of Data Reviewed Labs: ordered.  Risk Prescription drug management.   Patient is a 43 y.o. female  who presents to the emergency department for psychiatric complaint.  Past Medical History: obesity, tobacco dependence, bipolar disorder, schizoaffective disorder   Physical Exam: Refuses to cooperate with exam or answer questions, but is alert and obvious she is understanding questioning.  Later in her visit she becomes aggressive, and uncooperative with staff, attempting  to fight the police officers, nurses and techs.   Vital signs stable.  Labs: Medical clearance labs ordered, with following pertinent results: CMP overall unremarkable, glucose 110, creatinine 1.03 but not significantly changed from baseline.  Negative ethanol, acetaminophen , salicylate level.  CBC is notable for some leukocytosis, she does normally have leukocytosis on ED arrival, suspect secondary to agitation, drug use.  White blood cells 15.4.  No anemia or platelet dysfunction.  Medications: I ordered medication including Benadryl, Haldol  given for sedation due to aggressive behavior in the ED   Patient is under IVC order, first exam completed and paperwork given to secretary.  Disposition: Patient  is otherwise medically cleared at this time pending medical clearance laboratory evaluation. Will consult TTS and appreciate their recommendations.  I discussed this case with my attending physician Dr. Synetta Eves who cosigned this note including patient's presenting symptoms, physical exam, and planned diagnostics and interventions. Attending physician stated agreement with plan or made changes to plan which were implemented.   Final Clinical Impression(s) / ED Diagnoses Final diagnoses:  None    Rx / DC Orders ED Discharge Orders     None         Nelly Banco, PA-C 10/16/23 1534    Auston Blush, MD 10/17/23 1455

## 2023-10-16 NOTE — ED Triage Notes (Signed)
 Pt BIB [employer] for IVC. Pts daughter reports pt is bipolar schizophrenic and mixing her meds with drugs. Reports pt has been crying and having conversations with herself and talking to people that are not present.  Pt became violent toward her family, fighting with daughter and putting wholes in the wall. Per [employer] pt is uncooperative and refusing to answer questions

## 2023-10-16 NOTE — BH Assessment (Signed)
 Attempted to complete TTS assessment, per RN patient is currently sleeping from medication. Advised RN to reach out when patient is awake and oriented and TTS will complete the assessment at that time.

## 2023-10-17 DIAGNOSIS — F250 Schizoaffective disorder, bipolar type: Secondary | ICD-10-CM

## 2023-10-17 LAB — SARS CORONAVIRUS 2 BY RT PCR: SARS Coronavirus 2 by RT PCR: NEGATIVE

## 2023-10-17 MED ORDER — TRAZODONE HCL 50 MG PO TABS
50.0000 mg | ORAL_TABLET | Freq: Every day | ORAL | Status: DC
  Administered 2023-10-17: 50 mg via ORAL
  Filled 2023-10-17: qty 1

## 2023-10-17 MED ORDER — PANTOPRAZOLE SODIUM 40 MG PO TBEC
80.0000 mg | DELAYED_RELEASE_TABLET | Freq: Every day | ORAL | Status: DC
  Administered 2023-10-17 – 2023-10-18 (×2): 80 mg via ORAL
  Filled 2023-10-17 (×2): qty 2

## 2023-10-17 MED ORDER — CITALOPRAM HYDROBROMIDE 10 MG PO TABS
10.0000 mg | ORAL_TABLET | Freq: Every day | ORAL | Status: DC
Start: 2023-10-17 — End: 2023-10-18
  Administered 2023-10-17 – 2023-10-18 (×2): 10 mg via ORAL
  Filled 2023-10-17 (×2): qty 1

## 2023-10-17 MED ORDER — DICYCLOMINE HCL 20 MG PO TABS: 20.0000 mg | ORAL_TABLET | Freq: Three times a day (TID) | ORAL | Status: DC | PRN

## 2023-10-17 MED ORDER — OLANZAPINE 5 MG PO TABS
5.0000 mg | ORAL_TABLET | Freq: Every day | ORAL | Status: DC
  Administered 2023-10-17: 5 mg via ORAL
  Filled 2023-10-17: qty 1

## 2023-10-17 MED ORDER — CLONAZEPAM 0.5 MG PO TABS: 0.5000 mg | ORAL_TABLET | Freq: Every day | ORAL | Status: DC | PRN

## 2023-10-17 MED ORDER — HYDROXYZINE HCL 50 MG PO TABS: 50.0000 mg | ORAL_TABLET | Freq: Two times a day (BID) | ORAL | Status: DC | PRN

## 2023-10-17 NOTE — ED Notes (Signed)
 Pt at desk calling her mother-Monique,RN

## 2023-10-17 NOTE — ED Notes (Signed)
 Breakfast tray delivered

## 2023-10-17 NOTE — Progress Notes (Signed)
 Pt has been accepted to [facility] for 10/18/2023 Bed assignment: EM[facility] A UNIT   Pt meets inpatient criteria per: Morley Arabia   Attending Physician will be: .Dr. Johney Nageotte   Report can be called to: [phone]  Pt can arrive after: 8 AM TOMORROW   Care Team Notified: Roise Cleaver NP, Flora Humphreys RN  [place] Jailee Jaquez LCSW-A   10/17/2023 1:56 PM

## 2023-10-17 NOTE — Progress Notes (Signed)
 LCSW Progress Note  [id number]   Shondi Leonelli  10/17/2023  1:17 PM  Description:   Inpatient Psychiatric Referral  Patient was recommended inpatient per Angela McLauchlin, NP . There are no available beds at [facility], per [facility] Kathryn Parish RN.Patient was referred to the following out of network facilities:   Destination  Service Provider Address Phone Fax  [facility] [address] [place] [address] [phone]  [facility] [address] [place], [place] [address] [phone]  [facility] [address] [place] [address] [phone]  [facility] [address], [place] [address] [phone]  [facility] [address] [place] [address] [phone]  [facility] [address] [place] [address] [phone]  [facility] EFAX [address] [place], [place] [phone]  [facility] [address] [place] [address] [phone]  [facility] [address], [place] [address] [phone]  [facility] [address], [place] [address] [phone]      Situation ongoing, CSW to continue following and update chart as more information becomes available.      [place] Starlene Consuegra, MSW, LCSW  10/17/2023 1:17 PM

## 2023-10-17 NOTE — ED Notes (Signed)
 IVC'ED [facility] to retrive case number [phone]

## 2023-10-17 NOTE — Consult Note (Signed)
 [facility] Psychiatric Consult Initial  Patient Name: .Hannah Armstrong  MRN: [id number]  DOB: 02-22-1981  Consult Order details:  Orders (From admission, onward)     Start     Ordered   10/16/23 1320  CONSULT TO CALL ACT TEAM       Ordering Provider: Nelly Banco, PA-C  Provider:  (Not yet assigned)  Question:  Reason for Consult?  Answer:  Psych consult   10/16/23 1319             Mode of Visit: In person    Psychiatry Consult Evaluation  Service Date: Oct 17, 2023 LOS:  LOS: 0 days  Chief Complaint manic, medication noncompliance  Primary Psychiatric Diagnoses  Schizoaffective, bipolar type 2.  THC abuse 3.    Assessment  Hannah Armstrong is a 43 y.o. female admitted: Presented to the EDfor 10/16/2023  1:02 PM for bizarre behaviors at home, substance, medication noncompliance, manic like behaviors. She carries the psychiatric diagnoses of schizoaffective disorder, bipolar type and has a past medical history of  obesity.    Current outpatient psychotropic medications include risperidone , topamax , celexa, hydroxyzine  and historically she has had a positive response to these medications. She was not compliant with medications prior to admission as evidenced by patient report stating she does not need them. On initial examination, patient is cooperative, pressured and rapid speech, illogical. Please see plan below for detailed recommendations.   Diagnoses:  Active Hospital problems: Principal Problem:   Schizoaffective disorder, bipolar type (HCC)    Plan   ## Psychiatric Medication Recommendations:  Start Zyprexa  5 mg Qhs  ## Medical Decision Making Capacity: Not specifically addressed in this encounter   ## Disposition:-- We recommend inpatient psychiatric hospitalization after medical hospitalization. Patient has been involuntarily committed on 10/16/23.   ## Behavioral / Environmental: - No specific recommendations at this time.     ##  Safety and Observation Level:  - Based on my clinical evaluation, I estimate the patient to be at low risk of self harm in the current setting. - At this time, we recommend  routine. This decision is based on my review of the chart including patient's history and current presentation, interview of the patient, mental status examination, and consideration of suicide risk including evaluating suicidal ideation, plan, intent, suicidal or self-harm behaviors, risk factors, and protective factors. This judgment is based on our ability to directly address suicide risk, implement suicide prevention strategies, and develop a safety plan while the patient is in the clinical setting. Please contact our team if there is a concern that risk level has changed.  CSSR Risk Category:C-SSRS RISK CATEGORY: No Risk  Suicide Risk Assessment: Patient has following modifiable risk factors for suicide: recklessness and medication noncompliance, which we are addressing by inpatient admission. Patient has following non-modifiable or demographic risk factors for suicide: psychiatric hospitalization Patient has the following protective factors against suicide: Access to outpatient mental health care, Supportive family, and Supportive friends  Thank you for this consult request. Recommendations have been communicated to the primary team.  We will recommend IP treatment at this time.   Roise Cleaver, NP       History of Present Illness  Relevant Aspects of Hospital ED Course:  Hannah Armstrong is a 43 year old female who presents to [facility] under IVC escorted by [employer]. Per IVC" [name] is Bipolar Schizophrenia, [name] has become very aggressive. [name] is mixing her meds with drugs. [name] is very emotional crying and having conversation  with herself and talking to people that are not present. [name] has become violent toward her family. [name] is fighting with her daughter and pulling holes in  the wall. [name] is a danger to herself and others at this time and needs to be evaluated for possible mental illness."   Patient Report:  [facility] seen at [facility] ED for face-to-face psychiatric evaluation.  Patient is irritable but she is here in the hospital.  Her speech is pressured and rapid, she does appear illogical and guarded at times.  Patient denies all accusations listed on IVC.  She states she is only smoking marijuana occasionally and denies any other problems.  She states her daughter is causing her to have mental breakdowns but that is " normal."  She does also mention she is not taking any of her psychotropic medications other than Celexa and hydroxyzine .  She states she does not need anything as she needs to be "sharp mind to take care of her daughter."  She denies SI.  Denies HI.  Denies AVH.  I did explain to patient due to extensive collateral obtained from daughter and her mother, as well as her psychiatric history and medication noncompliance, we will uphold IVC and she will be recommended for inpatient treatment.  Please see collateral obtained by LCSW last night "[facility] contacted patients aunt Concetta Dee for collateral information. Bernardo Bridgeman reports that the patients behavior has changed over the course of the past 2 weeks. She states the patient told her that her medications were changed about 2 weeks ago and she believes this may be the cause of her current symptoms. She states she is normally a great mother to her 23 year old daughter and she lives in a hotel with her full time. She states the patients daughter reported that she noticed her mother started drinking beer and smoking marijuana with her medication and this was not a normal occurrence. Bernardo Bridgeman reports prior to her coming to the hospital she was acting erratically, punching holes in the wall and trying to fight her daughter. She states she did not hit her daughter. She states she was pretending to be a  wolf/howling, pretending she couldn't walk, and having auditory hallucinations.  Bernardo Bridgeman also reports that the patient's girlfriend broke up with her due to being scared of the patient. She states this may have caused her to have this outburst as well. She states her daughter is currently staying with her school friend until her mother is able stabilize her mood. She states she believes her medication needs to be reviewed and possibly adjusted. She states she does not know who her psychiatrist is either."  Pt has been accepted to [facility] for inpatient treatment.   Review of Systems  Psychiatric/Behavioral:         Manic  All other systems reviewed and are negative.    Psychiatric and Social History  Psychiatric History:  Information collected from patient, chart  Prev Dx/Sx: see above Current Psych Provider: yes Home Meds (current): celexa, hydroxyzine  Previous Med Trials: risperidone , abilify , topamax  Therapy: yes  Prior Psych Hospitalization: yes  Prior Self Harm: denies Prior Violence: denies   Social History:  Developmental Hx: wdl Legal Hx: denies Living Situation: lives with daughter Spiritual Hx: yes Access to weapons/lethal means: denies   Substance History Alcohol: yes, denies daily  Tobacco: yes Illicit drugs: thc Prescription drug abuse: denies   Exam Findings  Physical Exam:  Vital Signs:  Temp:  [97.9 F (36.6 C)-98.8 F (  37.1 C)] 98.8 F (37.1 C) (05/05 1145) Pulse Rate:  [74-99] 74 (05/05 1145) Resp:  [17-18] 17 (05/05 1145) BP: (110-120)/(68-79) 110/70 (05/05 1145) SpO2:  [96 %-99 %] 98 % (05/05 1145) Blood pressure 110/70, pulse 74, temperature 98.8 F (37.1 C), temperature source Oral, resp. rate 17, SpO2 98%. There is no height or weight on file to calculate BMI.  Physical Exam Vitals and nursing note reviewed.  Neurological:     Mental Status: She is alert and oriented to person, place, and time.     Mental Status Exam: General  Appearance: Fairly Groomed  Orientation:  Full (Time, Place, and Person)  Memory:  Immediate;   Fair Recent;   Fair  Concentration:  Concentration: Fair  Recall:  Fair  Attention  Fair  Eye Contact:  Good  Speech:  Pressured  Language:  Good  Volume:  Normal  Mood: anxious, irritable  Affect:  Congruent  Thought Process:  Irrelevant  Thought Content:  Illogical and Paranoid Ideation  Suicidal Thoughts:  No  Homicidal Thoughts:  No  Judgement:  Impaired  Insight:  Lacking  Psychomotor Activity:  Normal  Akathisia:  No  Fund of Knowledge:  Fair      Assets:  Communication Skills Desire for Improvement Leisure Time Physical Health Social Support  Cognition:  WNL  ADL's:  Intact  AIMS (if indicated):        Other History   These have been pulled in through the EMR, reviewed, and updated if appropriate.  Family History:  The patient's family history includes Breast cancer in her paternal aunt and sister; Diabetes in her maternal aunt; Hypertension in her mother; Sickle cell anemia in her paternal aunt.  Medical History: History reviewed. No pertinent past medical history.  Surgical History: Past Surgical History:  Procedure Laterality Date   ANTERIOR CRUCIATE LIGAMENT (ACL) REVISION Right 2016 Nov     Medications:   Current Facility-Administered Medications:    citalopram (CELEXA) tablet 10 mg, 10 mg, Oral, Daily, Ninetta Basket, MD, 10 mg at 10/17/23 0952   clonazePAM  (KLONOPIN ) tablet 0.5 mg, 0.5 mg, Oral, Daily PRN, Ninetta Basket, MD   dicyclomine (BENTYL) tablet 20 mg, 20 mg, Oral, Q8H PRN, Ninetta Basket, MD   hydrOXYzine  (ATARAX ) tablet 50 mg, 50 mg, Oral, BID PRN, Ninetta Basket, MD   OLANZapine  (ZYPREXA ) tablet 5 mg, 5 mg, Oral, QHS, Cindi Ghazarian, Lucious Sabina, NP   pantoprazole (PROTONIX) EC tablet 80 mg, 80 mg, Oral, Daily, Paterson, Robert C, MD, 80 mg at 10/17/23 0454   traZODone  (DESYREL ) tablet 50 mg, 50 mg, Oral, QHS, Ninetta Basket,  MD  Current Outpatient Medications:    citalopram (CELEXA) 10 MG tablet, Take 10 mg by mouth daily., Disp: , Rfl:    clonazePAM  (KLONOPIN ) 0.5 MG tablet, Take 0.5 mg by mouth daily as needed., Disp: , Rfl:    dicyclomine (BENTYL) 20 MG tablet, Take 20 mg by mouth every 8 (eight) hours as needed., Disp: , Rfl:    hydrOXYzine  (ATARAX ) 50 MG tablet, Take 50 mg by mouth 2 (two) times daily., Disp: , Rfl:    omeprazole (PRILOSEC) 40 MG capsule, Take 40 mg by mouth every morning., Disp: , Rfl:    traZODone  (DESYREL ) 50 MG tablet, Take 50 mg by mouth at bedtime., Disp: , Rfl:   Allergies: No Known Allergies  Roise Cleaver, NP

## 2023-10-17 NOTE — ED Provider Notes (Signed)
 Emergency Medicine Observation Re-evaluation Note  Hannah Armstrong is a 43 y.o. female, seen on rounds today.  Pt initially presented to the ED for complaints of Aggressive Behavior and IVC Currently, the patient is not having any acute complaints.  Physical Exam  BP 120/68 (BP Location: Right Arm)   Pulse 83   Temp 97.9 F (36.6 C) (Oral)   Resp 18   SpO2 99%  Physical Exam General: Standing by nursing station Lungs: Normal work of breathing Psych: Calm  ED Course / MDM  EKG:   I have reviewed the labs performed to date as well as medications administered while in observation.  Recent changes in the last 24 hours include seen by TTS who recommends inpatient admission.  Plan  Current plan is for placement.    Ninetta Basket, MD 10/17/23 [phone]

## 2023-10-18 NOTE — ED Notes (Signed)
 The Sheriff's Department has been contacted to arrange transportation to [facility].  The officer was informed that the patient has been made aware that she is being transported. Per officer, if they have to wrestle with the patient to get her in the vehicle, alternative transportation will need to be provided. No ETA could be provided.

## 2023-10-18 NOTE — ED Provider Notes (Signed)
  Physical Exam  BP 105/79 (BP Location: Right Arm)   Pulse 70   Temp 98 F (36.7 C) (Oral)   Resp 18   SpO2 95%   Physical Exam  Procedures  Procedures  ED Course / MDM   Clinical Course as of 10/18/23 0751  Sun Oct 16, 2023  1534 Clear for TTS [CP]    Clinical Course User Index [CP] Nelly Banco, PA-C   Medical Decision Making Amount and/or Complexity of Data Reviewed Labs: ordered.  Risk Prescription drug management.   Accepted at [facility].  Reevaluated prior to transfer.  Transport this morning.       Hannah Arias, MD 10/18/23 [phone]

## 2023-10-18 NOTE — ED Notes (Signed)
 Report called to Maggie Gyebi, RN at [phone] from [facility].

## 2023-10-18 NOTE — ED Notes (Signed)
 Please update Lisa Rideau (friend) [phone]
